# Patient Record
Sex: Female | Born: 1935
Health system: Southern US, Community
[De-identification: ages and names within clinical notes are randomized; demographics above are authoritative.]

## PROBLEM LIST (undated history)

## (undated) DIAGNOSIS — I10 Essential (primary) hypertension: Secondary | ICD-10-CM

## (undated) DIAGNOSIS — M1990 Unspecified osteoarthritis, unspecified site: Secondary | ICD-10-CM

## (undated) HISTORY — DX: Unspecified osteoarthritis, unspecified site: M19.90

## (undated) HISTORY — DX: Essential (primary) hypertension: I10

---

## 2015-08-15 DIAGNOSIS — J029 Acute pharyngitis, unspecified: Secondary | ICD-10-CM | POA: Diagnosis not present

## 2015-08-15 DIAGNOSIS — Z79899 Other long term (current) drug therapy: Secondary | ICD-10-CM | POA: Diagnosis not present

## 2015-08-15 DIAGNOSIS — R51 Headache: Secondary | ICD-10-CM | POA: Diagnosis not present

## 2015-08-15 DIAGNOSIS — I10 Essential (primary) hypertension: Secondary | ICD-10-CM | POA: Diagnosis not present

## 2015-09-20 DIAGNOSIS — I10 Essential (primary) hypertension: Secondary | ICD-10-CM | POA: Diagnosis not present

## 2015-09-20 DIAGNOSIS — N183 Chronic kidney disease, stage 3 (moderate): Secondary | ICD-10-CM | POA: Diagnosis not present

## 2015-09-20 DIAGNOSIS — Z0000 Encounter for general adult medical examination without abnormal findings: Secondary | ICD-10-CM | POA: Diagnosis not present

## 2016-02-07 DIAGNOSIS — J4520 Mild intermittent asthma, uncomplicated: Secondary | ICD-10-CM | POA: Diagnosis not present

## 2016-02-07 DIAGNOSIS — Z1389 Encounter for screening for other disorder: Secondary | ICD-10-CM | POA: Diagnosis not present

## 2016-02-07 DIAGNOSIS — I10 Essential (primary) hypertension: Secondary | ICD-10-CM | POA: Diagnosis not present

## 2016-02-07 DIAGNOSIS — Z0000 Encounter for general adult medical examination without abnormal findings: Secondary | ICD-10-CM | POA: Diagnosis not present

## 2016-04-04 DIAGNOSIS — J441 Chronic obstructive pulmonary disease with (acute) exacerbation: Secondary | ICD-10-CM | POA: Diagnosis not present

## 2016-04-04 DIAGNOSIS — I10 Essential (primary) hypertension: Secondary | ICD-10-CM | POA: Diagnosis not present

## 2016-04-18 DIAGNOSIS — G4489 Other headache syndrome: Secondary | ICD-10-CM | POA: Diagnosis not present

## 2016-04-18 DIAGNOSIS — J4520 Mild intermittent asthma, uncomplicated: Secondary | ICD-10-CM | POA: Diagnosis not present

## 2016-04-18 DIAGNOSIS — I10 Essential (primary) hypertension: Secondary | ICD-10-CM | POA: Diagnosis not present

## 2016-04-28 DIAGNOSIS — H40013 Open angle with borderline findings, low risk, bilateral: Secondary | ICD-10-CM | POA: Diagnosis not present

## 2016-04-28 DIAGNOSIS — R51 Headache: Secondary | ICD-10-CM | POA: Diagnosis not present

## 2016-04-28 DIAGNOSIS — H524 Presbyopia: Secondary | ICD-10-CM | POA: Diagnosis not present

## 2016-05-05 DIAGNOSIS — H40013 Open angle with borderline findings, low risk, bilateral: Secondary | ICD-10-CM | POA: Diagnosis not present

## 2016-05-05 DIAGNOSIS — R51 Headache: Secondary | ICD-10-CM | POA: Diagnosis not present

## 2016-05-06 DIAGNOSIS — Z23 Encounter for immunization: Secondary | ICD-10-CM | POA: Diagnosis not present

## 2016-05-09 DIAGNOSIS — I10 Essential (primary) hypertension: Secondary | ICD-10-CM | POA: Diagnosis not present

## 2016-05-09 DIAGNOSIS — J4520 Mild intermittent asthma, uncomplicated: Secondary | ICD-10-CM | POA: Diagnosis not present

## 2016-05-20 DIAGNOSIS — I10 Essential (primary) hypertension: Secondary | ICD-10-CM | POA: Diagnosis not present

## 2016-05-20 DIAGNOSIS — J441 Chronic obstructive pulmonary disease with (acute) exacerbation: Secondary | ICD-10-CM | POA: Diagnosis not present

## 2016-06-27 DIAGNOSIS — I10 Essential (primary) hypertension: Secondary | ICD-10-CM | POA: Diagnosis not present

## 2016-06-27 DIAGNOSIS — J441 Chronic obstructive pulmonary disease with (acute) exacerbation: Secondary | ICD-10-CM | POA: Diagnosis not present

## 2016-07-10 DIAGNOSIS — I10 Essential (primary) hypertension: Secondary | ICD-10-CM | POA: Diagnosis not present

## 2016-07-10 DIAGNOSIS — M25562 Pain in left knee: Secondary | ICD-10-CM | POA: Diagnosis not present

## 2016-07-10 DIAGNOSIS — J4520 Mild intermittent asthma, uncomplicated: Secondary | ICD-10-CM | POA: Diagnosis not present

## 2016-07-29 DIAGNOSIS — I10 Essential (primary) hypertension: Secondary | ICD-10-CM | POA: Diagnosis not present

## 2016-07-29 DIAGNOSIS — J441 Chronic obstructive pulmonary disease with (acute) exacerbation: Secondary | ICD-10-CM | POA: Diagnosis not present

## 2016-08-11 DIAGNOSIS — J948 Other specified pleural conditions: Secondary | ICD-10-CM | POA: Diagnosis not present

## 2016-08-11 DIAGNOSIS — R1114 Bilious vomiting: Secondary | ICD-10-CM | POA: Diagnosis not present

## 2016-08-12 DIAGNOSIS — R1114 Bilious vomiting: Secondary | ICD-10-CM | POA: Diagnosis not present

## 2016-08-13 DIAGNOSIS — R1114 Bilious vomiting: Secondary | ICD-10-CM | POA: Diagnosis not present

## 2016-08-14 DIAGNOSIS — R1114 Bilious vomiting: Secondary | ICD-10-CM | POA: Diagnosis not present

## 2016-08-15 DIAGNOSIS — R1114 Bilious vomiting: Secondary | ICD-10-CM | POA: Diagnosis not present

## 2016-08-21 DIAGNOSIS — H40023 Open angle with borderline findings, high risk, bilateral: Secondary | ICD-10-CM | POA: Diagnosis not present

## 2016-09-05 DIAGNOSIS — J441 Chronic obstructive pulmonary disease with (acute) exacerbation: Secondary | ICD-10-CM | POA: Diagnosis not present

## 2016-09-05 DIAGNOSIS — I10 Essential (primary) hypertension: Secondary | ICD-10-CM | POA: Diagnosis not present

## 2016-10-13 DIAGNOSIS — J018 Other acute sinusitis: Secondary | ICD-10-CM | POA: Diagnosis not present

## 2016-10-13 DIAGNOSIS — J4520 Mild intermittent asthma, uncomplicated: Secondary | ICD-10-CM | POA: Diagnosis not present

## 2016-10-13 DIAGNOSIS — M25562 Pain in left knee: Secondary | ICD-10-CM | POA: Diagnosis not present

## 2016-10-13 DIAGNOSIS — Z0000 Encounter for general adult medical examination without abnormal findings: Secondary | ICD-10-CM | POA: Diagnosis not present

## 2016-10-13 DIAGNOSIS — Z1389 Encounter for screening for other disorder: Secondary | ICD-10-CM | POA: Diagnosis not present

## 2016-10-13 DIAGNOSIS — I10 Essential (primary) hypertension: Secondary | ICD-10-CM | POA: Diagnosis not present

## 2016-10-31 DIAGNOSIS — I10 Essential (primary) hypertension: Secondary | ICD-10-CM | POA: Diagnosis not present

## 2016-10-31 DIAGNOSIS — J441 Chronic obstructive pulmonary disease with (acute) exacerbation: Secondary | ICD-10-CM | POA: Diagnosis not present

## 2016-11-26 DIAGNOSIS — J441 Chronic obstructive pulmonary disease with (acute) exacerbation: Secondary | ICD-10-CM | POA: Diagnosis not present

## 2016-11-26 DIAGNOSIS — I10 Essential (primary) hypertension: Secondary | ICD-10-CM | POA: Diagnosis not present

## 2017-01-13 DIAGNOSIS — J4520 Mild intermittent asthma, uncomplicated: Secondary | ICD-10-CM | POA: Diagnosis not present

## 2017-01-13 DIAGNOSIS — I10 Essential (primary) hypertension: Secondary | ICD-10-CM | POA: Diagnosis not present

## 2017-01-13 DIAGNOSIS — N183 Chronic kidney disease, stage 3 (moderate): Secondary | ICD-10-CM | POA: Diagnosis not present

## 2017-01-13 DIAGNOSIS — M25562 Pain in left knee: Secondary | ICD-10-CM | POA: Diagnosis not present

## 2017-02-19 DIAGNOSIS — J441 Chronic obstructive pulmonary disease with (acute) exacerbation: Secondary | ICD-10-CM | POA: Diagnosis not present

## 2017-02-19 DIAGNOSIS — I10 Essential (primary) hypertension: Secondary | ICD-10-CM | POA: Diagnosis not present

## 2017-03-05 DIAGNOSIS — H671 Otitis media in diseases classified elsewhere, right ear: Secondary | ICD-10-CM | POA: Diagnosis not present

## 2017-04-07 DIAGNOSIS — J441 Chronic obstructive pulmonary disease with (acute) exacerbation: Secondary | ICD-10-CM | POA: Diagnosis not present

## 2017-04-07 DIAGNOSIS — I10 Essential (primary) hypertension: Secondary | ICD-10-CM | POA: Diagnosis not present

## 2017-05-26 DIAGNOSIS — J441 Chronic obstructive pulmonary disease with (acute) exacerbation: Secondary | ICD-10-CM | POA: Diagnosis not present

## 2017-05-26 DIAGNOSIS — I10 Essential (primary) hypertension: Secondary | ICD-10-CM | POA: Diagnosis not present

## 2017-06-08 DIAGNOSIS — I10 Essential (primary) hypertension: Secondary | ICD-10-CM | POA: Diagnosis not present

## 2017-06-08 DIAGNOSIS — M1A3710 Chronic gout due to renal impairment, right ankle and foot, without tophus (tophi): Secondary | ICD-10-CM | POA: Diagnosis not present

## 2017-06-23 DIAGNOSIS — J441 Chronic obstructive pulmonary disease with (acute) exacerbation: Secondary | ICD-10-CM | POA: Diagnosis not present

## 2017-06-23 DIAGNOSIS — I10 Essential (primary) hypertension: Secondary | ICD-10-CM | POA: Diagnosis not present

## 2017-07-20 DIAGNOSIS — I10 Essential (primary) hypertension: Secondary | ICD-10-CM | POA: Diagnosis not present

## 2017-07-20 DIAGNOSIS — J441 Chronic obstructive pulmonary disease with (acute) exacerbation: Secondary | ICD-10-CM | POA: Diagnosis not present

## 2017-07-22 DIAGNOSIS — I10 Essential (primary) hypertension: Secondary | ICD-10-CM | POA: Diagnosis not present

## 2017-07-22 DIAGNOSIS — J441 Chronic obstructive pulmonary disease with (acute) exacerbation: Secondary | ICD-10-CM | POA: Diagnosis not present

## 2017-09-10 DIAGNOSIS — M1A3710 Chronic gout due to renal impairment, right ankle and foot, without tophus (tophi): Secondary | ICD-10-CM | POA: Diagnosis not present

## 2017-09-10 DIAGNOSIS — Z6827 Body mass index (BMI) 27.0-27.9, adult: Secondary | ICD-10-CM | POA: Diagnosis not present

## 2017-09-10 DIAGNOSIS — M1712 Unilateral primary osteoarthritis, left knee: Secondary | ICD-10-CM | POA: Diagnosis not present

## 2017-09-10 DIAGNOSIS — I10 Essential (primary) hypertension: Secondary | ICD-10-CM | POA: Diagnosis not present

## 2017-09-22 DIAGNOSIS — J441 Chronic obstructive pulmonary disease with (acute) exacerbation: Secondary | ICD-10-CM | POA: Diagnosis not present

## 2017-09-22 DIAGNOSIS — I10 Essential (primary) hypertension: Secondary | ICD-10-CM | POA: Diagnosis not present

## 2017-10-09 DIAGNOSIS — I10 Essential (primary) hypertension: Secondary | ICD-10-CM | POA: Diagnosis not present

## 2017-10-09 DIAGNOSIS — J441 Chronic obstructive pulmonary disease with (acute) exacerbation: Secondary | ICD-10-CM | POA: Diagnosis not present

## 2017-11-18 DIAGNOSIS — J441 Chronic obstructive pulmonary disease with (acute) exacerbation: Secondary | ICD-10-CM | POA: Diagnosis not present

## 2017-11-18 DIAGNOSIS — I10 Essential (primary) hypertension: Secondary | ICD-10-CM | POA: Diagnosis not present

## 2017-12-08 DIAGNOSIS — Z6827 Body mass index (BMI) 27.0-27.9, adult: Secondary | ICD-10-CM | POA: Diagnosis not present

## 2017-12-08 DIAGNOSIS — M1A3710 Chronic gout due to renal impairment, right ankle and foot, without tophus (tophi): Secondary | ICD-10-CM | POA: Diagnosis not present

## 2017-12-08 DIAGNOSIS — J40 Bronchitis, not specified as acute or chronic: Secondary | ICD-10-CM | POA: Diagnosis not present

## 2017-12-08 DIAGNOSIS — M1712 Unilateral primary osteoarthritis, left knee: Secondary | ICD-10-CM | POA: Diagnosis not present

## 2017-12-08 DIAGNOSIS — I10 Essential (primary) hypertension: Secondary | ICD-10-CM | POA: Diagnosis not present

## 2018-01-01 DIAGNOSIS — J441 Chronic obstructive pulmonary disease with (acute) exacerbation: Secondary | ICD-10-CM | POA: Diagnosis not present

## 2018-01-01 DIAGNOSIS — I10 Essential (primary) hypertension: Secondary | ICD-10-CM | POA: Diagnosis not present

## 2018-01-01 DIAGNOSIS — M1712 Unilateral primary osteoarthritis, left knee: Secondary | ICD-10-CM | POA: Diagnosis not present

## 2018-02-23 DIAGNOSIS — I10 Essential (primary) hypertension: Secondary | ICD-10-CM | POA: Diagnosis not present

## 2018-02-23 DIAGNOSIS — M1712 Unilateral primary osteoarthritis, left knee: Secondary | ICD-10-CM | POA: Diagnosis not present

## 2018-02-23 DIAGNOSIS — J441 Chronic obstructive pulmonary disease with (acute) exacerbation: Secondary | ICD-10-CM | POA: Diagnosis not present

## 2018-03-11 DIAGNOSIS — M1712 Unilateral primary osteoarthritis, left knee: Secondary | ICD-10-CM | POA: Diagnosis not present

## 2018-03-11 DIAGNOSIS — N182 Chronic kidney disease, stage 2 (mild): Secondary | ICD-10-CM | POA: Diagnosis not present

## 2018-03-11 DIAGNOSIS — Z6826 Body mass index (BMI) 26.0-26.9, adult: Secondary | ICD-10-CM | POA: Diagnosis not present

## 2018-03-11 DIAGNOSIS — I10 Essential (primary) hypertension: Secondary | ICD-10-CM | POA: Diagnosis not present

## 2018-03-11 DIAGNOSIS — M1A3710 Chronic gout due to renal impairment, right ankle and foot, without tophus (tophi): Secondary | ICD-10-CM | POA: Diagnosis not present

## 2018-03-23 DIAGNOSIS — M1712 Unilateral primary osteoarthritis, left knee: Secondary | ICD-10-CM | POA: Diagnosis not present

## 2018-03-23 DIAGNOSIS — I10 Essential (primary) hypertension: Secondary | ICD-10-CM | POA: Diagnosis not present

## 2018-03-23 DIAGNOSIS — N182 Chronic kidney disease, stage 2 (mild): Secondary | ICD-10-CM | POA: Diagnosis not present

## 2018-04-19 DIAGNOSIS — M1712 Unilateral primary osteoarthritis, left knee: Secondary | ICD-10-CM | POA: Diagnosis not present

## 2018-04-19 DIAGNOSIS — I10 Essential (primary) hypertension: Secondary | ICD-10-CM | POA: Diagnosis not present

## 2018-04-19 DIAGNOSIS — N182 Chronic kidney disease, stage 2 (mild): Secondary | ICD-10-CM | POA: Diagnosis not present

## 2018-05-14 DIAGNOSIS — M1712 Unilateral primary osteoarthritis, left knee: Secondary | ICD-10-CM | POA: Diagnosis not present

## 2018-05-14 DIAGNOSIS — N182 Chronic kidney disease, stage 2 (mild): Secondary | ICD-10-CM | POA: Diagnosis not present

## 2018-05-14 DIAGNOSIS — I10 Essential (primary) hypertension: Secondary | ICD-10-CM | POA: Diagnosis not present

## 2018-06-11 DIAGNOSIS — N182 Chronic kidney disease, stage 2 (mild): Secondary | ICD-10-CM | POA: Diagnosis not present

## 2018-06-11 DIAGNOSIS — M1712 Unilateral primary osteoarthritis, left knee: Secondary | ICD-10-CM | POA: Diagnosis not present

## 2018-06-11 DIAGNOSIS — I10 Essential (primary) hypertension: Secondary | ICD-10-CM | POA: Diagnosis not present

## 2018-06-15 DIAGNOSIS — M1712 Unilateral primary osteoarthritis, left knee: Secondary | ICD-10-CM | POA: Diagnosis not present

## 2018-06-15 DIAGNOSIS — Z0000 Encounter for general adult medical examination without abnormal findings: Secondary | ICD-10-CM | POA: Diagnosis not present

## 2018-06-15 DIAGNOSIS — M1A3710 Chronic gout due to renal impairment, right ankle and foot, without tophus (tophi): Secondary | ICD-10-CM | POA: Diagnosis not present

## 2018-06-15 DIAGNOSIS — I10 Essential (primary) hypertension: Secondary | ICD-10-CM | POA: Diagnosis not present

## 2018-06-15 DIAGNOSIS — N182 Chronic kidney disease, stage 2 (mild): Secondary | ICD-10-CM | POA: Diagnosis not present

## 2018-06-15 DIAGNOSIS — Z1389 Encounter for screening for other disorder: Secondary | ICD-10-CM | POA: Diagnosis not present

## 2018-06-15 DIAGNOSIS — Z6826 Body mass index (BMI) 26.0-26.9, adult: Secondary | ICD-10-CM | POA: Diagnosis not present

## 2018-06-15 DIAGNOSIS — J449 Chronic obstructive pulmonary disease, unspecified: Secondary | ICD-10-CM | POA: Diagnosis not present

## 2018-07-08 DIAGNOSIS — M85832 Other specified disorders of bone density and structure, left forearm: Secondary | ICD-10-CM | POA: Diagnosis not present

## 2018-07-08 DIAGNOSIS — M810 Age-related osteoporosis without current pathological fracture: Secondary | ICD-10-CM | POA: Diagnosis not present

## 2018-07-29 DIAGNOSIS — Z6829 Body mass index (BMI) 29.0-29.9, adult: Secondary | ICD-10-CM | POA: Diagnosis not present

## 2018-07-29 DIAGNOSIS — R05 Cough: Secondary | ICD-10-CM | POA: Diagnosis not present

## 2018-07-29 DIAGNOSIS — R0602 Shortness of breath: Secondary | ICD-10-CM | POA: Diagnosis not present

## 2018-07-29 DIAGNOSIS — J441 Chronic obstructive pulmonary disease with (acute) exacerbation: Secondary | ICD-10-CM | POA: Diagnosis not present

## 2018-07-29 DIAGNOSIS — J449 Chronic obstructive pulmonary disease, unspecified: Secondary | ICD-10-CM | POA: Diagnosis not present

## 2018-07-30 DIAGNOSIS — I10 Essential (primary) hypertension: Secondary | ICD-10-CM | POA: Diagnosis not present

## 2018-07-30 DIAGNOSIS — J449 Chronic obstructive pulmonary disease, unspecified: Secondary | ICD-10-CM | POA: Diagnosis not present

## 2018-09-07 DIAGNOSIS — J449 Chronic obstructive pulmonary disease, unspecified: Secondary | ICD-10-CM | POA: Diagnosis not present

## 2018-09-07 DIAGNOSIS — I10 Essential (primary) hypertension: Secondary | ICD-10-CM | POA: Diagnosis not present

## 2018-09-21 DIAGNOSIS — Z0000 Encounter for general adult medical examination without abnormal findings: Secondary | ICD-10-CM | POA: Diagnosis not present

## 2018-09-21 DIAGNOSIS — Z6829 Body mass index (BMI) 29.0-29.9, adult: Secondary | ICD-10-CM | POA: Diagnosis not present

## 2018-09-21 DIAGNOSIS — J441 Chronic obstructive pulmonary disease with (acute) exacerbation: Secondary | ICD-10-CM | POA: Diagnosis not present

## 2018-09-21 DIAGNOSIS — I10 Essential (primary) hypertension: Secondary | ICD-10-CM | POA: Diagnosis not present

## 2018-10-04 DIAGNOSIS — I10 Essential (primary) hypertension: Secondary | ICD-10-CM | POA: Diagnosis not present

## 2018-10-04 DIAGNOSIS — J441 Chronic obstructive pulmonary disease with (acute) exacerbation: Secondary | ICD-10-CM | POA: Diagnosis not present

## 2018-11-22 DIAGNOSIS — T162XXA Foreign body in left ear, initial encounter: Secondary | ICD-10-CM | POA: Diagnosis not present

## 2018-11-22 DIAGNOSIS — Z6830 Body mass index (BMI) 30.0-30.9, adult: Secondary | ICD-10-CM | POA: Diagnosis not present

## 2018-11-25 DIAGNOSIS — I10 Essential (primary) hypertension: Secondary | ICD-10-CM | POA: Diagnosis not present

## 2018-11-25 DIAGNOSIS — J449 Chronic obstructive pulmonary disease, unspecified: Secondary | ICD-10-CM | POA: Diagnosis not present

## 2018-12-03 DIAGNOSIS — E7800 Pure hypercholesterolemia, unspecified: Secondary | ICD-10-CM | POA: Diagnosis not present

## 2018-12-03 DIAGNOSIS — T162XXA Foreign body in left ear, initial encounter: Secondary | ICD-10-CM | POA: Diagnosis not present

## 2018-12-03 DIAGNOSIS — Z79899 Other long term (current) drug therapy: Secondary | ICD-10-CM | POA: Diagnosis not present

## 2018-12-03 DIAGNOSIS — I10 Essential (primary) hypertension: Secondary | ICD-10-CM | POA: Diagnosis not present

## 2018-12-21 DIAGNOSIS — J449 Chronic obstructive pulmonary disease, unspecified: Secondary | ICD-10-CM | POA: Diagnosis not present

## 2018-12-21 DIAGNOSIS — I10 Essential (primary) hypertension: Secondary | ICD-10-CM | POA: Diagnosis not present

## 2018-12-21 DIAGNOSIS — Z6830 Body mass index (BMI) 30.0-30.9, adult: Secondary | ICD-10-CM | POA: Diagnosis not present

## 2018-12-29 DIAGNOSIS — L110 Acquired keratosis follicularis: Secondary | ICD-10-CM | POA: Diagnosis not present

## 2018-12-29 DIAGNOSIS — M79671 Pain in right foot: Secondary | ICD-10-CM | POA: Diagnosis not present

## 2018-12-29 DIAGNOSIS — M79672 Pain in left foot: Secondary | ICD-10-CM | POA: Diagnosis not present

## 2018-12-29 DIAGNOSIS — I739 Peripheral vascular disease, unspecified: Secondary | ICD-10-CM | POA: Diagnosis not present

## 2018-12-29 DIAGNOSIS — M79674 Pain in right toe(s): Secondary | ICD-10-CM | POA: Diagnosis not present

## 2018-12-29 DIAGNOSIS — M2042 Other hammer toe(s) (acquired), left foot: Secondary | ICD-10-CM | POA: Diagnosis not present

## 2018-12-29 DIAGNOSIS — M79675 Pain in left toe(s): Secondary | ICD-10-CM | POA: Diagnosis not present

## 2018-12-30 DIAGNOSIS — H524 Presbyopia: Secondary | ICD-10-CM | POA: Diagnosis not present

## 2018-12-31 DIAGNOSIS — I10 Essential (primary) hypertension: Secondary | ICD-10-CM | POA: Diagnosis not present

## 2018-12-31 DIAGNOSIS — J449 Chronic obstructive pulmonary disease, unspecified: Secondary | ICD-10-CM | POA: Diagnosis not present

## 2019-01-31 DIAGNOSIS — J449 Chronic obstructive pulmonary disease, unspecified: Secondary | ICD-10-CM | POA: Diagnosis not present

## 2019-01-31 DIAGNOSIS — I10 Essential (primary) hypertension: Secondary | ICD-10-CM | POA: Diagnosis not present

## 2019-02-25 DIAGNOSIS — Z79899 Other long term (current) drug therapy: Secondary | ICD-10-CM | POA: Diagnosis not present

## 2019-02-25 DIAGNOSIS — I10 Essential (primary) hypertension: Secondary | ICD-10-CM | POA: Diagnosis not present

## 2019-02-25 DIAGNOSIS — X501XXA Overexertion from prolonged static or awkward postures, initial encounter: Secondary | ICD-10-CM | POA: Diagnosis not present

## 2019-02-25 DIAGNOSIS — S76312A Strain of muscle, fascia and tendon of the posterior muscle group at thigh level, left thigh, initial encounter: Secondary | ICD-10-CM | POA: Diagnosis not present

## 2019-02-25 DIAGNOSIS — M1712 Unilateral primary osteoarthritis, left knee: Secondary | ICD-10-CM | POA: Diagnosis not present

## 2019-03-11 DIAGNOSIS — I10 Essential (primary) hypertension: Secondary | ICD-10-CM | POA: Diagnosis not present

## 2019-03-11 DIAGNOSIS — J449 Chronic obstructive pulmonary disease, unspecified: Secondary | ICD-10-CM | POA: Diagnosis not present

## 2019-03-23 DIAGNOSIS — J449 Chronic obstructive pulmonary disease, unspecified: Secondary | ICD-10-CM | POA: Diagnosis not present

## 2019-03-23 DIAGNOSIS — M10051 Idiopathic gout, right hip: Secondary | ICD-10-CM | POA: Diagnosis not present

## 2019-03-23 DIAGNOSIS — I10 Essential (primary) hypertension: Secondary | ICD-10-CM | POA: Diagnosis not present

## 2019-03-23 DIAGNOSIS — Z6830 Body mass index (BMI) 30.0-30.9, adult: Secondary | ICD-10-CM | POA: Diagnosis not present

## 2019-04-18 DIAGNOSIS — M79662 Pain in left lower leg: Secondary | ICD-10-CM | POA: Diagnosis not present

## 2019-04-18 DIAGNOSIS — Z6829 Body mass index (BMI) 29.0-29.9, adult: Secondary | ICD-10-CM | POA: Diagnosis not present

## 2019-04-19 DIAGNOSIS — J449 Chronic obstructive pulmonary disease, unspecified: Secondary | ICD-10-CM | POA: Diagnosis not present

## 2019-04-19 DIAGNOSIS — I10 Essential (primary) hypertension: Secondary | ICD-10-CM | POA: Diagnosis not present

## 2019-04-22 DIAGNOSIS — M79662 Pain in left lower leg: Secondary | ICD-10-CM | POA: Diagnosis not present

## 2019-05-23 DIAGNOSIS — I10 Essential (primary) hypertension: Secondary | ICD-10-CM | POA: Diagnosis not present

## 2019-05-23 DIAGNOSIS — J449 Chronic obstructive pulmonary disease, unspecified: Secondary | ICD-10-CM | POA: Diagnosis not present

## 2019-06-21 DIAGNOSIS — I10 Essential (primary) hypertension: Secondary | ICD-10-CM | POA: Diagnosis not present

## 2019-06-21 DIAGNOSIS — J449 Chronic obstructive pulmonary disease, unspecified: Secondary | ICD-10-CM | POA: Diagnosis not present

## 2019-06-28 DIAGNOSIS — Z6829 Body mass index (BMI) 29.0-29.9, adult: Secondary | ICD-10-CM | POA: Diagnosis not present

## 2019-06-28 DIAGNOSIS — M79662 Pain in left lower leg: Secondary | ICD-10-CM | POA: Diagnosis not present

## 2019-06-28 DIAGNOSIS — M189 Osteoarthritis of first carpometacarpal joint, unspecified: Secondary | ICD-10-CM | POA: Diagnosis not present

## 2019-06-28 DIAGNOSIS — Z0000 Encounter for general adult medical examination without abnormal findings: Secondary | ICD-10-CM | POA: Diagnosis not present

## 2019-06-28 DIAGNOSIS — I10 Essential (primary) hypertension: Secondary | ICD-10-CM | POA: Diagnosis not present

## 2019-06-28 DIAGNOSIS — Z1389 Encounter for screening for other disorder: Secondary | ICD-10-CM | POA: Diagnosis not present

## 2019-07-18 DIAGNOSIS — J449 Chronic obstructive pulmonary disease, unspecified: Secondary | ICD-10-CM | POA: Diagnosis not present

## 2019-07-18 DIAGNOSIS — I10 Essential (primary) hypertension: Secondary | ICD-10-CM | POA: Diagnosis not present

## 2019-08-15 DIAGNOSIS — I10 Essential (primary) hypertension: Secondary | ICD-10-CM | POA: Diagnosis not present

## 2019-08-15 DIAGNOSIS — J449 Chronic obstructive pulmonary disease, unspecified: Secondary | ICD-10-CM | POA: Diagnosis not present

## 2019-09-08 DIAGNOSIS — I10 Essential (primary) hypertension: Secondary | ICD-10-CM | POA: Diagnosis not present

## 2019-09-08 DIAGNOSIS — J449 Chronic obstructive pulmonary disease, unspecified: Secondary | ICD-10-CM | POA: Diagnosis not present

## 2019-09-19 DIAGNOSIS — M1712 Unilateral primary osteoarthritis, left knee: Secondary | ICD-10-CM | POA: Diagnosis not present

## 2019-09-19 DIAGNOSIS — M25462 Effusion, left knee: Secondary | ICD-10-CM | POA: Diagnosis not present

## 2019-09-19 DIAGNOSIS — M25562 Pain in left knee: Secondary | ICD-10-CM | POA: Diagnosis not present

## 2019-09-26 DIAGNOSIS — I10 Essential (primary) hypertension: Secondary | ICD-10-CM | POA: Diagnosis not present

## 2019-09-26 DIAGNOSIS — M79662 Pain in left lower leg: Secondary | ICD-10-CM | POA: Diagnosis not present

## 2019-09-26 DIAGNOSIS — Z0000 Encounter for general adult medical examination without abnormal findings: Secondary | ICD-10-CM | POA: Diagnosis not present

## 2019-09-26 DIAGNOSIS — J4520 Mild intermittent asthma, uncomplicated: Secondary | ICD-10-CM | POA: Diagnosis not present

## 2019-09-26 DIAGNOSIS — Z6828 Body mass index (BMI) 28.0-28.9, adult: Secondary | ICD-10-CM | POA: Diagnosis not present

## 2019-09-29 ENCOUNTER — Encounter: Payer: Self-pay | Admitting: Orthopaedic Surgery

## 2019-09-29 ENCOUNTER — Other Ambulatory Visit: Payer: Self-pay

## 2019-09-29 ENCOUNTER — Ambulatory Visit (INDEPENDENT_AMBULATORY_CARE_PROVIDER_SITE_OTHER): Payer: PPO | Admitting: Orthopaedic Surgery

## 2019-09-29 DIAGNOSIS — M1712 Unilateral primary osteoarthritis, left knee: Secondary | ICD-10-CM

## 2019-09-29 NOTE — Progress Notes (Signed)
Office Visit Note   Patient: Susan Ross           Date of Birth: 11/24/1928           MRN: [id number] Visit Date: 09/29/2019              Requested by: No referring provider defined for this encounter. PCP: Toma Deiters, MD   Assessment & Plan: Visit Diagnoses:  1. Unilateral primary osteoarthritis, left knee     Plan: Patient has some mild medial compartment arthritis by x-ray.  She states recently her knees been doing better.  If not severe enough to consider further treatment in her opinion.  I plan to recheck her in 2 months.  Follow-Up Instructions: No follow-ups on file.   Orders:  No orders of the defined types were placed in this encounter.  No orders of the defined types were placed in this encounter.     Procedures: No procedures performed   Clinical Data: No additional findings.   Subjective: Chief Complaint  Patient presents with  . Left Knee - Pain    HPI 86 year old female seen with greater than 1 year history of knee giving way pain and swelling.  She has had previous x-rays taken and over-the-counter anti-inflammatories used ice and heat nothing really helps.  She does have some hypertension.  No previous surgeries.  She is referred by [facility] for evaluation.  Previous x-ray showed some mild medial joint line narrowing small medial osteophytes.  Mild patellofemoral degenerative changes.  No chondrocalcinosis.  Patient denies any groin pain.  Review of Systems positive for knee pain hypertension no previous surgeries.  She is a Tourist information centre manager.   Objective: Vital Signs: BP (!) 198/106   Pulse 78   Ht 5' (1.524 m)   Wt 160 lb (72.6 kg)   BMI 31.25 kg/m   Physical Exam Constitutional:      Appearance: She is well-developed.  HENT:     Head: Normocephalic.     Right Ear: External ear normal.     Left Ear: External ear normal.  Eyes:     Pupils: Pupils are equal, round, and reactive to light.  Neck:     Thyroid: No thyromegaly.      Trachea: No tracheal deviation.  Cardiovascular:     Rate and Rhythm: Normal rate.  Pulmonary:     Effort: Pulmonary effort is normal.  Abdominal:     Palpations: Abdomen is soft.  Skin:    General: Skin is warm and dry.  Neurological:     Mental Status: She is alert and oriented to person, place, and time.  Psychiatric:        Behavior: Behavior normal.     Ortho Exam patient is amatory with a mild knee limp.  No pain with hip range of motion.  No knee effusion she has some pain with patellofemoral loading patellar tendon quad tendon is normal.  Negative logroll to the hips.  Distal pulses intact.  Specialty Comments:  No specialty comments available.  Imaging: No results found.   PMFS History: Patient Active Problem List   Diagnosis Date Noted  . Unilateral primary osteoarthritis, left knee 09/29/2019   Past Medical History:  Diagnosis Date  . Hypertension   . Osteoarthritis     Family History  Problem Relation Age of Onset  . Cancer Mother   . Congestive Heart Failure Father     History reviewed. No pertinent surgical history. Social History   Occupational  History  . Not on file  Tobacco Use  . Smoking status: Former Research scientist (life sciences)  . Smokeless tobacco: Never Used  Substance and Sexual Activity  . Alcohol use: Not Currently  . Drug use: Not on file  . Sexual activity: Not on file

## 2019-10-03 DIAGNOSIS — J4520 Mild intermittent asthma, uncomplicated: Secondary | ICD-10-CM | POA: Diagnosis not present

## 2019-10-03 DIAGNOSIS — I10 Essential (primary) hypertension: Secondary | ICD-10-CM | POA: Diagnosis not present

## 2019-10-31 DIAGNOSIS — I10 Essential (primary) hypertension: Secondary | ICD-10-CM | POA: Diagnosis not present

## 2019-10-31 DIAGNOSIS — J4520 Mild intermittent asthma, uncomplicated: Secondary | ICD-10-CM | POA: Diagnosis not present

## 2019-12-05 DIAGNOSIS — I10 Essential (primary) hypertension: Secondary | ICD-10-CM | POA: Diagnosis not present

## 2019-12-05 DIAGNOSIS — J4520 Mild intermittent asthma, uncomplicated: Secondary | ICD-10-CM | POA: Diagnosis not present

## 2019-12-27 DIAGNOSIS — Z6828 Body mass index (BMI) 28.0-28.9, adult: Secondary | ICD-10-CM | POA: Diagnosis not present

## 2019-12-27 DIAGNOSIS — M79662 Pain in left lower leg: Secondary | ICD-10-CM | POA: Diagnosis not present

## 2019-12-27 DIAGNOSIS — I10 Essential (primary) hypertension: Secondary | ICD-10-CM | POA: Diagnosis not present

## 2019-12-27 DIAGNOSIS — J449 Chronic obstructive pulmonary disease, unspecified: Secondary | ICD-10-CM | POA: Diagnosis not present

## 2020-01-03 DIAGNOSIS — I10 Essential (primary) hypertension: Secondary | ICD-10-CM | POA: Diagnosis not present

## 2020-01-03 DIAGNOSIS — J449 Chronic obstructive pulmonary disease, unspecified: Secondary | ICD-10-CM | POA: Diagnosis not present

## 2020-02-16 ENCOUNTER — Encounter: Payer: Self-pay | Admitting: Orthopaedic Surgery

## 2020-02-16 ENCOUNTER — Ambulatory Visit (INDEPENDENT_AMBULATORY_CARE_PROVIDER_SITE_OTHER): Payer: PPO | Admitting: Orthopaedic Surgery

## 2020-02-16 ENCOUNTER — Other Ambulatory Visit: Payer: Self-pay

## 2020-02-16 VITALS — Ht 67.0 in | Wt 160.0 lb

## 2020-02-16 DIAGNOSIS — M1712 Unilateral primary osteoarthritis, left knee: Secondary | ICD-10-CM

## 2020-02-16 MED ORDER — LIDOCAINE HCL 1 % IJ SOLN
0.5000 mL | INTRAMUSCULAR | Status: AC | PRN
  Administered 2020-02-16: .5 mL

## 2020-02-16 MED ORDER — METHYLPREDNISOLONE ACETATE 40 MG/ML IJ SUSP
40.0000 mg | INTRAMUSCULAR | Status: AC | PRN
  Administered 2020-02-16: 40 mg via INTRA_ARTICULAR

## 2020-02-16 MED ORDER — BUPIVACAINE HCL 0.5 % IJ SOLN
3.0000 mL | INTRAMUSCULAR | Status: AC | PRN
  Administered 2020-02-16: 3 mL via INTRA_ARTICULAR

## 2020-02-16 NOTE — Progress Notes (Signed)
Office Visit Note   Patient: Susan Ross           Date of Birth: 02-Jun-1929           MRN: [id number] Visit Date: 02/16/2020              Requested by: Toma Deiters, MD [address] [place],  [place] [address] PCP: Toma Deiters, MD   Assessment & Plan: Visit Diagnoses:  1. Unilateral primary osteoarthritis, left knee     Plan: Repeat left knee injection performed Which she tolerated well.  She has persistent problems injection does not give her sustained relief she will call we can proceed with MRI scan.  She does have knee osteoarthritis although it does not appear severe.  She may have more cartilage wears then x ray suggests.  If she is having increased problems will get an MRI scan. Follow-Up Instructions: No follow-ups on file.   Orders:  Orders Placed This Encounter  Procedures  . Large Joint Inj: L knee   No orders of the defined types were placed in this encounter.     Procedures: Large Joint Inj: L knee on 02/16/2020 4:03 PM Indications: joint swelling and pain Details: 22 G 1.5 in needle, anterolateral approach  Arthrogram: No  Medications: 0.5 mL lidocaine 1 %; 3 mL bupivacaine 0.5 %; 40 mg methylPREDNISolone acetate 40 MG/ML Outcome: tolerated well, no immediate complications Procedure, treatment alternatives, risks and benefits explained, specific risks discussed. Consent was given by the patient. Immediately prior to procedure a time out was called to verify the correct patient, procedure, equipment, support staff and site/side marked as required. Patient was prepped and draped in the usual sterile fashion.       Clinical Data: No additional findings.   Subjective: Chief Complaint  Patient presents with  . Left Knee - Pain    HPI 86 year old female with left knee pain medial and posterior with limp.  She had an intra-articular injection for knee osteoarthritis by x-ray which appears to be moderate level of arthritis and she states the  shot worked well for definitely more than a month.  She has had previous total of arthroplasty on the right hip which did well.  She denies any radicular symptoms to her foot.  She has problems with stairs problems going from sitting to standing due to her left knee.  She has had some hydrocodone for pain last Norco was 60 tablets back in January.  Patient been amatory but limping.  Her daughter was here briefly but left since she did not want to see a needle.  Review of Systems no activity intolerance no chest pain negative history of CVA.   Objective: Vital Signs: Ht 5\' 7"  (1.702 m)   Wt 160 lb (72.6 kg)   BMI 25.06 kg/m   Physical Exam Constitutional:      Appearance: She is well-developed.  HENT:     Head: Normocephalic.     Right Ear: External ear normal.     Left Ear: External ear normal.  Eyes:     Pupils: Pupils are equal, round, and reactive to light.  Neck:     Thyroid: No thyromegaly.     Trachea: No tracheal deviation.  Cardiovascular:     Rate and Rhythm: Normal rate.  Pulmonary:     Effort: Pulmonary effort is normal.  Abdominal:     Palpations: Abdomen is soft.  Skin:    General: Skin is warm and dry.  Neurological:     Mental Status: She is alert and oriented to person, place, and time.  Psychiatric:        Behavior: Behavior normal.     Ortho Exam patient has grip with knee extension lacks 15 degrees reaching full extension.  Pain with passive pressure at the end of extension.  She can flex to 115 degrees.  Medial joint line tenderness and some posterior medial tenderness.  Specialty Comments:  No specialty comments available.  Imaging: No results found.   PMFS History: Patient Active Problem List   Diagnosis Date Noted  . Unilateral primary osteoarthritis, left knee 09/29/2019   Past Medical History:  Diagnosis Date  . Hypertension   . Osteoarthritis     Family History  Problem Relation Age of Onset  . Cancer Mother   . Congestive Heart  Failure Father     No past surgical history on file. Social History   Occupational History  . Not on file  Tobacco Use  . Smoking status: Former Games developer  . Smokeless tobacco: Never Used  Substance and Sexual Activity  . Alcohol use: Not Currently  . Drug use: Not on file  . Sexual activity: Not on file

## 2020-02-21 ENCOUNTER — Other Ambulatory Visit: Payer: Self-pay

## 2020-02-24 DIAGNOSIS — J449 Chronic obstructive pulmonary disease, unspecified: Secondary | ICD-10-CM | POA: Diagnosis not present

## 2020-02-24 DIAGNOSIS — I10 Essential (primary) hypertension: Secondary | ICD-10-CM | POA: Diagnosis not present

## 2020-03-23 DIAGNOSIS — I10 Essential (primary) hypertension: Secondary | ICD-10-CM | POA: Diagnosis not present

## 2020-03-23 DIAGNOSIS — J449 Chronic obstructive pulmonary disease, unspecified: Secondary | ICD-10-CM | POA: Diagnosis not present

## 2020-03-28 DIAGNOSIS — I10 Essential (primary) hypertension: Secondary | ICD-10-CM | POA: Diagnosis not present

## 2020-03-28 DIAGNOSIS — M79662 Pain in left lower leg: Secondary | ICD-10-CM | POA: Diagnosis not present

## 2020-03-28 DIAGNOSIS — Z6828 Body mass index (BMI) 28.0-28.9, adult: Secondary | ICD-10-CM | POA: Diagnosis not present

## 2020-03-28 DIAGNOSIS — J449 Chronic obstructive pulmonary disease, unspecified: Secondary | ICD-10-CM | POA: Diagnosis not present

## 2020-03-29 DIAGNOSIS — J449 Chronic obstructive pulmonary disease, unspecified: Secondary | ICD-10-CM | POA: Diagnosis not present

## 2020-03-29 DIAGNOSIS — I10 Essential (primary) hypertension: Secondary | ICD-10-CM | POA: Diagnosis not present

## 2020-04-26 DIAGNOSIS — M542 Cervicalgia: Secondary | ICD-10-CM | POA: Diagnosis not present

## 2020-04-26 DIAGNOSIS — Z6828 Body mass index (BMI) 28.0-28.9, adult: Secondary | ICD-10-CM | POA: Diagnosis not present

## 2020-04-26 DIAGNOSIS — M545 Low back pain: Secondary | ICD-10-CM | POA: Diagnosis not present

## 2020-05-10 DIAGNOSIS — J449 Chronic obstructive pulmonary disease, unspecified: Secondary | ICD-10-CM | POA: Diagnosis not present

## 2020-05-10 DIAGNOSIS — I10 Essential (primary) hypertension: Secondary | ICD-10-CM | POA: Diagnosis not present

## 2020-06-08 DIAGNOSIS — I10 Essential (primary) hypertension: Secondary | ICD-10-CM | POA: Diagnosis not present

## 2020-06-08 DIAGNOSIS — J449 Chronic obstructive pulmonary disease, unspecified: Secondary | ICD-10-CM | POA: Diagnosis not present

## 2020-06-27 DIAGNOSIS — M542 Cervicalgia: Secondary | ICD-10-CM | POA: Diagnosis not present

## 2020-06-27 DIAGNOSIS — J449 Chronic obstructive pulmonary disease, unspecified: Secondary | ICD-10-CM | POA: Diagnosis not present

## 2020-06-27 DIAGNOSIS — N189 Chronic kidney disease, unspecified: Secondary | ICD-10-CM | POA: Diagnosis not present

## 2020-06-27 DIAGNOSIS — M545 Low back pain, unspecified: Secondary | ICD-10-CM | POA: Diagnosis not present

## 2020-06-27 DIAGNOSIS — Z6828 Body mass index (BMI) 28.0-28.9, adult: Secondary | ICD-10-CM | POA: Diagnosis not present

## 2020-06-27 DIAGNOSIS — I10 Essential (primary) hypertension: Secondary | ICD-10-CM | POA: Diagnosis not present

## 2020-07-06 DIAGNOSIS — J449 Chronic obstructive pulmonary disease, unspecified: Secondary | ICD-10-CM | POA: Diagnosis not present

## 2020-07-06 DIAGNOSIS — M545 Low back pain, unspecified: Secondary | ICD-10-CM | POA: Diagnosis not present

## 2020-07-06 DIAGNOSIS — I10 Essential (primary) hypertension: Secondary | ICD-10-CM | POA: Diagnosis not present

## 2020-07-06 DIAGNOSIS — N189 Chronic kidney disease, unspecified: Secondary | ICD-10-CM | POA: Diagnosis not present

## 2020-09-17 DIAGNOSIS — H524 Presbyopia: Secondary | ICD-10-CM | POA: Diagnosis not present

## 2020-09-17 DIAGNOSIS — H40013 Open angle with borderline findings, low risk, bilateral: Secondary | ICD-10-CM | POA: Diagnosis not present

## 2020-09-27 DIAGNOSIS — J449 Chronic obstructive pulmonary disease, unspecified: Secondary | ICD-10-CM | POA: Diagnosis not present

## 2020-09-27 DIAGNOSIS — I10 Essential (primary) hypertension: Secondary | ICD-10-CM | POA: Diagnosis not present

## 2020-09-27 DIAGNOSIS — N189 Chronic kidney disease, unspecified: Secondary | ICD-10-CM | POA: Diagnosis not present

## 2020-09-27 DIAGNOSIS — M545 Low back pain, unspecified: Secondary | ICD-10-CM | POA: Diagnosis not present

## 2020-09-27 DIAGNOSIS — Z6828 Body mass index (BMI) 28.0-28.9, adult: Secondary | ICD-10-CM | POA: Diagnosis not present

## 2020-10-31 ENCOUNTER — Other Ambulatory Visit: Payer: Self-pay | Admitting: Physical Medicine and Rehabilitation

## 2020-10-31 ENCOUNTER — Other Ambulatory Visit (HOSPITAL_COMMUNITY): Payer: Self-pay | Admitting: Physical Medicine and Rehabilitation

## 2020-10-31 DIAGNOSIS — M25551 Pain in right hip: Secondary | ICD-10-CM

## 2020-11-24 DIAGNOSIS — M79605 Pain in left leg: Secondary | ICD-10-CM | POA: Diagnosis not present

## 2020-11-24 DIAGNOSIS — M25562 Pain in left knee: Secondary | ICD-10-CM | POA: Diagnosis not present

## 2020-11-24 DIAGNOSIS — R9431 Abnormal electrocardiogram [ECG] [EKG]: Secondary | ICD-10-CM | POA: Diagnosis not present

## 2020-11-24 DIAGNOSIS — G8929 Other chronic pain: Secondary | ICD-10-CM | POA: Diagnosis not present

## 2020-11-24 DIAGNOSIS — I10 Essential (primary) hypertension: Secondary | ICD-10-CM | POA: Diagnosis not present

## 2020-12-26 DIAGNOSIS — M545 Low back pain, unspecified: Secondary | ICD-10-CM | POA: Diagnosis not present

## 2020-12-26 DIAGNOSIS — J449 Chronic obstructive pulmonary disease, unspecified: Secondary | ICD-10-CM | POA: Diagnosis not present

## 2020-12-26 DIAGNOSIS — Z6829 Body mass index (BMI) 29.0-29.9, adult: Secondary | ICD-10-CM | POA: Diagnosis not present

## 2020-12-26 DIAGNOSIS — N189 Chronic kidney disease, unspecified: Secondary | ICD-10-CM | POA: Diagnosis not present

## 2020-12-26 DIAGNOSIS — I10 Essential (primary) hypertension: Secondary | ICD-10-CM | POA: Diagnosis not present

## 2021-01-21 DIAGNOSIS — R059 Cough, unspecified: Secondary | ICD-10-CM | POA: Diagnosis not present

## 2021-01-21 DIAGNOSIS — I10 Essential (primary) hypertension: Secondary | ICD-10-CM | POA: Diagnosis not present

## 2021-01-21 DIAGNOSIS — Z6829 Body mass index (BMI) 29.0-29.9, adult: Secondary | ICD-10-CM | POA: Diagnosis not present

## 2021-01-21 DIAGNOSIS — J440 Chronic obstructive pulmonary disease with acute lower respiratory infection: Secondary | ICD-10-CM | POA: Diagnosis not present

## 2021-04-02 DIAGNOSIS — N1832 Chronic kidney disease, stage 3b: Secondary | ICD-10-CM | POA: Diagnosis not present

## 2021-04-02 DIAGNOSIS — I10 Essential (primary) hypertension: Secondary | ICD-10-CM | POA: Diagnosis not present

## 2021-04-02 DIAGNOSIS — Z6830 Body mass index (BMI) 30.0-30.9, adult: Secondary | ICD-10-CM | POA: Diagnosis not present

## 2021-04-02 DIAGNOSIS — I5022 Chronic systolic (congestive) heart failure: Secondary | ICD-10-CM | POA: Diagnosis not present

## 2021-04-02 DIAGNOSIS — Z0000 Encounter for general adult medical examination without abnormal findings: Secondary | ICD-10-CM | POA: Diagnosis not present

## 2021-04-02 DIAGNOSIS — J440 Chronic obstructive pulmonary disease with acute lower respiratory infection: Secondary | ICD-10-CM | POA: Diagnosis not present

## 2021-04-02 DIAGNOSIS — R0602 Shortness of breath: Secondary | ICD-10-CM | POA: Diagnosis not present

## 2021-04-16 DIAGNOSIS — I272 Pulmonary hypertension, unspecified: Secondary | ICD-10-CM | POA: Diagnosis not present

## 2021-04-16 DIAGNOSIS — I5022 Chronic systolic (congestive) heart failure: Secondary | ICD-10-CM | POA: Diagnosis not present

## 2021-05-08 DIAGNOSIS — J80 Acute respiratory distress syndrome: Secondary | ICD-10-CM | POA: Diagnosis not present

## 2021-05-08 DIAGNOSIS — I110 Hypertensive heart disease with heart failure: Secondary | ICD-10-CM | POA: Diagnosis not present

## 2021-05-08 DIAGNOSIS — Z20822 Contact with and (suspected) exposure to covid-19: Secondary | ICD-10-CM | POA: Diagnosis not present

## 2021-05-08 DIAGNOSIS — J441 Chronic obstructive pulmonary disease with (acute) exacerbation: Secondary | ICD-10-CM | POA: Diagnosis not present

## 2021-05-08 DIAGNOSIS — R9431 Abnormal electrocardiogram [ECG] [EKG]: Secondary | ICD-10-CM | POA: Diagnosis not present

## 2021-05-08 DIAGNOSIS — R0689 Other abnormalities of breathing: Secondary | ICD-10-CM | POA: Diagnosis not present

## 2021-05-08 DIAGNOSIS — R1084 Generalized abdominal pain: Secondary | ICD-10-CM | POA: Diagnosis not present

## 2021-05-08 DIAGNOSIS — R52 Pain, unspecified: Secondary | ICD-10-CM | POA: Diagnosis not present

## 2021-05-08 DIAGNOSIS — R0602 Shortness of breath: Secondary | ICD-10-CM | POA: Diagnosis not present

## 2021-05-08 DIAGNOSIS — R059 Cough, unspecified: Secondary | ICD-10-CM | POA: Diagnosis not present

## 2021-05-08 DIAGNOSIS — I509 Heart failure, unspecified: Secondary | ICD-10-CM | POA: Diagnosis not present

## 2021-05-08 DIAGNOSIS — R778 Other specified abnormalities of plasma proteins: Secondary | ICD-10-CM | POA: Diagnosis not present

## 2021-05-08 DIAGNOSIS — R000 Tachycardia, unspecified: Secondary | ICD-10-CM | POA: Diagnosis not present

## 2021-05-09 DIAGNOSIS — B179 Acute viral hepatitis, unspecified: Secondary | ICD-10-CM | POA: Diagnosis not present

## 2021-05-09 DIAGNOSIS — E119 Type 2 diabetes mellitus without complications: Secondary | ICD-10-CM | POA: Diagnosis not present

## 2021-05-09 DIAGNOSIS — I5043 Acute on chronic combined systolic (congestive) and diastolic (congestive) heart failure: Secondary | ICD-10-CM | POA: Diagnosis not present

## 2021-05-09 DIAGNOSIS — R0602 Shortness of breath: Secondary | ICD-10-CM | POA: Diagnosis not present

## 2021-05-09 DIAGNOSIS — J9622 Acute and chronic respiratory failure with hypercapnia: Secondary | ICD-10-CM | POA: Diagnosis not present

## 2021-05-09 DIAGNOSIS — Z66 Do not resuscitate: Secondary | ICD-10-CM | POA: Diagnosis not present

## 2021-05-09 DIAGNOSIS — I129 Hypertensive chronic kidney disease with stage 1 through stage 4 chronic kidney disease, or unspecified chronic kidney disease: Secondary | ICD-10-CM | POA: Diagnosis not present

## 2021-05-09 DIAGNOSIS — M1712 Unilateral primary osteoarthritis, left knee: Secondary | ICD-10-CM | POA: Diagnosis not present

## 2021-05-09 DIAGNOSIS — E8729 Other acidosis: Secondary | ICD-10-CM | POA: Diagnosis not present

## 2021-05-09 DIAGNOSIS — R1110 Vomiting, unspecified: Secondary | ICD-10-CM | POA: Diagnosis not present

## 2021-05-09 DIAGNOSIS — R778 Other specified abnormalities of plasma proteins: Secondary | ICD-10-CM | POA: Diagnosis not present

## 2021-05-09 DIAGNOSIS — R918 Other nonspecific abnormal finding of lung field: Secondary | ICD-10-CM | POA: Diagnosis not present

## 2021-05-09 DIAGNOSIS — I130 Hypertensive heart and chronic kidney disease with heart failure and stage 1 through stage 4 chronic kidney disease, or unspecified chronic kidney disease: Secondary | ICD-10-CM | POA: Diagnosis not present

## 2021-05-09 DIAGNOSIS — J441 Chronic obstructive pulmonary disease with (acute) exacerbation: Secondary | ICD-10-CM | POA: Diagnosis not present

## 2021-05-09 DIAGNOSIS — R079 Chest pain, unspecified: Secondary | ICD-10-CM | POA: Diagnosis not present

## 2021-05-09 DIAGNOSIS — I5023 Acute on chronic systolic (congestive) heart failure: Secondary | ICD-10-CM | POA: Diagnosis not present

## 2021-05-09 DIAGNOSIS — B189 Chronic viral hepatitis, unspecified: Secondary | ICD-10-CM | POA: Diagnosis not present

## 2021-05-09 DIAGNOSIS — J9621 Acute and chronic respiratory failure with hypoxia: Secondary | ICD-10-CM | POA: Diagnosis not present

## 2021-05-09 DIAGNOSIS — I3489 Other nonrheumatic mitral valve disorders: Secondary | ICD-10-CM | POA: Diagnosis not present

## 2021-05-09 DIAGNOSIS — I5020 Unspecified systolic (congestive) heart failure: Secondary | ICD-10-CM | POA: Diagnosis not present

## 2021-05-09 DIAGNOSIS — I5041 Acute combined systolic (congestive) and diastolic (congestive) heart failure: Secondary | ICD-10-CM | POA: Diagnosis not present

## 2021-05-09 DIAGNOSIS — J440 Chronic obstructive pulmonary disease with acute lower respiratory infection: Secondary | ICD-10-CM | POA: Diagnosis not present

## 2021-05-09 DIAGNOSIS — Z87891 Personal history of nicotine dependence: Secondary | ICD-10-CM | POA: Diagnosis not present

## 2021-05-09 DIAGNOSIS — N179 Acute kidney failure, unspecified: Secondary | ICD-10-CM | POA: Diagnosis not present

## 2021-05-09 DIAGNOSIS — I2510 Atherosclerotic heart disease of native coronary artery without angina pectoris: Secondary | ICD-10-CM | POA: Diagnosis not present

## 2021-05-09 DIAGNOSIS — I21A1 Myocardial infarction type 2: Secondary | ICD-10-CM | POA: Diagnosis not present

## 2021-05-09 DIAGNOSIS — J449 Chronic obstructive pulmonary disease, unspecified: Secondary | ICD-10-CM | POA: Diagnosis not present

## 2021-05-09 DIAGNOSIS — R9431 Abnormal electrocardiogram [ECG] [EKG]: Secondary | ICD-10-CM | POA: Diagnosis not present

## 2021-05-09 DIAGNOSIS — I5181 Takotsubo syndrome: Secondary | ICD-10-CM | POA: Diagnosis not present

## 2021-05-09 DIAGNOSIS — N189 Chronic kidney disease, unspecified: Secondary | ICD-10-CM | POA: Diagnosis not present

## 2021-05-09 DIAGNOSIS — R0902 Hypoxemia: Secondary | ICD-10-CM | POA: Diagnosis not present

## 2021-05-20 DIAGNOSIS — I5032 Chronic diastolic (congestive) heart failure: Secondary | ICD-10-CM | POA: Diagnosis not present

## 2021-05-20 DIAGNOSIS — J449 Chronic obstructive pulmonary disease, unspecified: Secondary | ICD-10-CM | POA: Diagnosis not present

## 2021-05-20 DIAGNOSIS — N189 Chronic kidney disease, unspecified: Secondary | ICD-10-CM | POA: Diagnosis not present

## 2021-05-20 DIAGNOSIS — Z0000 Encounter for general adult medical examination without abnormal findings: Secondary | ICD-10-CM | POA: Diagnosis not present

## 2021-05-20 DIAGNOSIS — I10 Essential (primary) hypertension: Secondary | ICD-10-CM | POA: Diagnosis not present

## 2021-05-20 DIAGNOSIS — Z6830 Body mass index (BMI) 30.0-30.9, adult: Secondary | ICD-10-CM | POA: Diagnosis not present

## 2021-05-29 DIAGNOSIS — Z6831 Body mass index (BMI) 31.0-31.9, adult: Secondary | ICD-10-CM | POA: Diagnosis not present

## 2021-05-29 DIAGNOSIS — J449 Chronic obstructive pulmonary disease, unspecified: Secondary | ICD-10-CM | POA: Diagnosis not present

## 2021-05-30 DIAGNOSIS — I5022 Chronic systolic (congestive) heart failure: Secondary | ICD-10-CM | POA: Diagnosis not present

## 2021-05-30 DIAGNOSIS — I5181 Takotsubo syndrome: Secondary | ICD-10-CM | POA: Diagnosis not present

## 2021-05-30 DIAGNOSIS — I2510 Atherosclerotic heart disease of native coronary artery without angina pectoris: Secondary | ICD-10-CM | POA: Diagnosis not present

## 2021-05-30 DIAGNOSIS — I10 Essential (primary) hypertension: Secondary | ICD-10-CM | POA: Diagnosis not present

## 2021-06-13 DIAGNOSIS — J449 Chronic obstructive pulmonary disease, unspecified: Secondary | ICD-10-CM | POA: Diagnosis not present

## 2021-06-13 DIAGNOSIS — I5181 Takotsubo syndrome: Secondary | ICD-10-CM | POA: Diagnosis not present

## 2021-06-26 ENCOUNTER — Other Ambulatory Visit: Payer: Self-pay

## 2021-06-26 ENCOUNTER — Encounter: Payer: PPO | Attending: Internal Medicine | Admitting: Nutrition

## 2021-06-26 VITALS — Ht 63.0 in | Wt 173.0 lb

## 2021-06-26 DIAGNOSIS — Z9981 Dependence on supplemental oxygen: Secondary | ICD-10-CM | POA: Insufficient documentation

## 2021-06-26 DIAGNOSIS — I5020 Unspecified systolic (congestive) heart failure: Secondary | ICD-10-CM | POA: Insufficient documentation

## 2021-06-26 DIAGNOSIS — J441 Chronic obstructive pulmonary disease with (acute) exacerbation: Secondary | ICD-10-CM | POA: Insufficient documentation

## 2021-06-26 DIAGNOSIS — I10 Essential (primary) hypertension: Secondary | ICD-10-CM | POA: Insufficient documentation

## 2021-06-26 NOTE — Progress Notes (Signed)
Medical Nutrition Therapy  Appointment Start time:  1300  Appointment End time:  1400  Primary concerns today: CHF and COPD, HTN  Referral diagnosis:  I10. I50.2 Preferred learning style: visual  Learning readiness: Ready   NUTRITION ASSESSMENT  She is here with her daughter. Wants to know what foods to avoid to prevent CHF episode and anything help with her breathing. Still likes to cook some but has to sit on a stool and can't stand long. She walks with assistance, feeds self, very interactive. Limited hearing, but engages in conversation well. Is willing to work on foods that are healthier for her to help her breathing and prevent CHF problems.  Anthropometrics  Wt Readings from Last 3 Encounters:  06/26/21 173 lb (78.5 kg)  02/16/20 160 lb (72.6 kg)  09/29/19 160 lb (72.6 kg)   Ht Readings from Last 3 Encounters:  06/26/21 5\' 3"  (1.6 m)  02/16/20 5\' 7"  (1.702 m)  09/29/19 5' (1.524 m)   Body mass index is 30.65 kg/m. @BMIFA @ Facility age limit for growth percentiles is 20 years. Facility age limit for growth percentiles is 20 years.    Clinical Medical Hx: CHF, COPD, on oxygen Medications: see chart Labs:  Notable Signs/Symptoms: SOB at times, fluid retention at times  Lifestyle & Dietary Hx LIves with family. Needs assistance with dressing and bathing due to breathing issues.  Estimated daily fluid intake: 26 oz Supplements:  Sleep: good Stress / self-care: caring for self and breathing issues Current average weekly physical activity: ADL  24-Hr Dietary Recall Eats 2-3 meals a day. Sometimes isn't hungry. She comes very little but her daughters cook. Use to cook with fat back and salt. Likes most foods. Likes fried foods.  Estimated Energy Needs Calories: 1500-1800 Carbohydrate: 170 g Protein: 112g Fat: 42g   NUTRITION DIAGNOSIS  NB-1.1 Food and nutrition-related knowledge deficit As related to CHF.  As evidenced by Recent hospitalization and eating  foods with higher sodium content..   NUTRITION INTERVENTION  Nutrition education (E-1) on the following topics:  Low Salt Nutrition and CHF education provided on My Plate, CHO counting, meal planning- small frequent meals, portion sizes, timing of meals. Low Salt Diet Reading food labels COPD and foods to avoid-concentrated sweets, carbonated liquids and processed foods.  Handouts Provided Include  CHF Nutrition Therapy Low Salt seasonings  Learning Style & Readiness for Change Teaching method utilized: Visual & Auditory  Demonstrated degree of understanding via: Teach Back  Barriers to learning/adherence to lifestyle change: None  Goals Established by Pt Follow CHF guidelines with foods to avoid Read food labels Eat small frequent meals Focus on nutrient dense foods       Drink 40 oz of water per day       Avoid simple sweets, desserts and processed foods.       Increase fresh fruits and vegetables and whole gains.    MONITORING & EVALUATION Dietary intake, weekly physical activity, and weight and breathing in 2-3 months.  Next Steps  Patient is to eat more fresh fruits and vegetables and less processed foods.

## 2021-07-02 DIAGNOSIS — N1832 Chronic kidney disease, stage 3b: Secondary | ICD-10-CM | POA: Diagnosis not present

## 2021-07-02 DIAGNOSIS — I5032 Chronic diastolic (congestive) heart failure: Secondary | ICD-10-CM | POA: Diagnosis not present

## 2021-07-02 DIAGNOSIS — Z6829 Body mass index (BMI) 29.0-29.9, adult: Secondary | ICD-10-CM | POA: Diagnosis not present

## 2021-07-02 DIAGNOSIS — J449 Chronic obstructive pulmonary disease, unspecified: Secondary | ICD-10-CM | POA: Diagnosis not present

## 2021-07-02 DIAGNOSIS — Z0000 Encounter for general adult medical examination without abnormal findings: Secondary | ICD-10-CM | POA: Diagnosis not present

## 2021-07-02 DIAGNOSIS — E785 Hyperlipidemia, unspecified: Secondary | ICD-10-CM | POA: Diagnosis not present

## 2021-07-10 ENCOUNTER — Encounter: Payer: Self-pay | Admitting: Nutrition

## 2021-07-10 NOTE — Patient Instructions (Signed)
Goals Established by Pt Follow CHF guidelines with foods to avoid Read food labels Eat small frequent meals Focus on nutrient dense foods       Drink 40 oz of water per day       Avoid simple sweets, desserts and processed foods.       Increase fresh fruits and vegetables and whole gains.

## 2021-07-13 DIAGNOSIS — J449 Chronic obstructive pulmonary disease, unspecified: Secondary | ICD-10-CM | POA: Diagnosis not present

## 2021-07-13 DIAGNOSIS — I5181 Takotsubo syndrome: Secondary | ICD-10-CM | POA: Diagnosis not present

## 2021-08-13 DIAGNOSIS — I5181 Takotsubo syndrome: Secondary | ICD-10-CM | POA: Diagnosis not present

## 2021-08-13 DIAGNOSIS — J449 Chronic obstructive pulmonary disease, unspecified: Secondary | ICD-10-CM | POA: Diagnosis not present

## 2021-08-22 ENCOUNTER — Ambulatory Visit: Payer: PPO | Admitting: Nutrition

## 2021-09-02 DIAGNOSIS — I2510 Atherosclerotic heart disease of native coronary artery without angina pectoris: Secondary | ICD-10-CM | POA: Diagnosis not present

## 2021-09-02 DIAGNOSIS — I10 Essential (primary) hypertension: Secondary | ICD-10-CM | POA: Diagnosis not present

## 2021-09-02 DIAGNOSIS — I5022 Chronic systolic (congestive) heart failure: Secondary | ICD-10-CM | POA: Diagnosis not present

## 2021-09-10 DIAGNOSIS — I272 Pulmonary hypertension, unspecified: Secondary | ICD-10-CM | POA: Diagnosis not present

## 2021-09-10 DIAGNOSIS — I5022 Chronic systolic (congestive) heart failure: Secondary | ICD-10-CM | POA: Diagnosis not present

## 2021-09-10 DIAGNOSIS — I340 Nonrheumatic mitral (valve) insufficiency: Secondary | ICD-10-CM | POA: Diagnosis not present

## 2021-09-24 DIAGNOSIS — E785 Hyperlipidemia, unspecified: Secondary | ICD-10-CM | POA: Diagnosis not present

## 2021-09-24 DIAGNOSIS — I10 Essential (primary) hypertension: Secondary | ICD-10-CM | POA: Diagnosis not present

## 2021-09-24 DIAGNOSIS — J449 Chronic obstructive pulmonary disease, unspecified: Secondary | ICD-10-CM | POA: Diagnosis not present

## 2021-09-24 DIAGNOSIS — I5032 Chronic diastolic (congestive) heart failure: Secondary | ICD-10-CM | POA: Diagnosis not present

## 2021-09-24 DIAGNOSIS — N1832 Chronic kidney disease, stage 3b: Secondary | ICD-10-CM | POA: Diagnosis not present

## 2021-09-30 DIAGNOSIS — H40013 Open angle with borderline findings, low risk, bilateral: Secondary | ICD-10-CM | POA: Diagnosis not present

## 2021-10-01 DIAGNOSIS — I10 Essential (primary) hypertension: Secondary | ICD-10-CM | POA: Diagnosis not present

## 2021-10-01 DIAGNOSIS — Z0000 Encounter for general adult medical examination without abnormal findings: Secondary | ICD-10-CM | POA: Diagnosis not present

## 2021-10-01 DIAGNOSIS — E785 Hyperlipidemia, unspecified: Secondary | ICD-10-CM | POA: Diagnosis not present

## 2021-10-01 DIAGNOSIS — Z6830 Body mass index (BMI) 30.0-30.9, adult: Secondary | ICD-10-CM | POA: Diagnosis not present

## 2021-10-01 DIAGNOSIS — I5032 Chronic diastolic (congestive) heart failure: Secondary | ICD-10-CM | POA: Diagnosis not present

## 2021-10-01 DIAGNOSIS — J449 Chronic obstructive pulmonary disease, unspecified: Secondary | ICD-10-CM | POA: Diagnosis not present

## 2021-10-01 DIAGNOSIS — N1832 Chronic kidney disease, stage 3b: Secondary | ICD-10-CM | POA: Diagnosis not present

## 2021-10-11 DIAGNOSIS — I5181 Takotsubo syndrome: Secondary | ICD-10-CM | POA: Diagnosis not present

## 2021-10-11 DIAGNOSIS — J449 Chronic obstructive pulmonary disease, unspecified: Secondary | ICD-10-CM | POA: Diagnosis not present

## 2021-10-15 DIAGNOSIS — Z7951 Long term (current) use of inhaled steroids: Secondary | ICD-10-CM | POA: Diagnosis not present

## 2021-10-15 DIAGNOSIS — R600 Localized edema: Secondary | ICD-10-CM | POA: Diagnosis not present

## 2021-10-15 DIAGNOSIS — R32 Unspecified urinary incontinence: Secondary | ICD-10-CM | POA: Diagnosis not present

## 2021-10-15 DIAGNOSIS — K08109 Complete loss of teeth, unspecified cause, unspecified class: Secondary | ICD-10-CM | POA: Diagnosis not present

## 2021-10-15 DIAGNOSIS — Z809 Family history of malignant neoplasm, unspecified: Secondary | ICD-10-CM | POA: Diagnosis not present

## 2021-10-15 DIAGNOSIS — G8929 Other chronic pain: Secondary | ICD-10-CM | POA: Diagnosis not present

## 2021-10-15 DIAGNOSIS — E785 Hyperlipidemia, unspecified: Secondary | ICD-10-CM | POA: Diagnosis not present

## 2021-10-15 DIAGNOSIS — I10 Essential (primary) hypertension: Secondary | ICD-10-CM | POA: Diagnosis not present

## 2021-10-15 DIAGNOSIS — Z7982 Long term (current) use of aspirin: Secondary | ICD-10-CM | POA: Diagnosis not present

## 2021-10-15 DIAGNOSIS — G629 Polyneuropathy, unspecified: Secondary | ICD-10-CM | POA: Diagnosis not present

## 2021-10-15 DIAGNOSIS — I2510 Atherosclerotic heart disease of native coronary artery without angina pectoris: Secondary | ICD-10-CM | POA: Diagnosis not present

## 2021-10-15 DIAGNOSIS — J309 Allergic rhinitis, unspecified: Secondary | ICD-10-CM | POA: Diagnosis not present

## 2021-10-21 ENCOUNTER — Other Ambulatory Visit: Payer: Self-pay

## 2021-10-21 ENCOUNTER — Ambulatory Visit (INDEPENDENT_AMBULATORY_CARE_PROVIDER_SITE_OTHER): Payer: Medicare HMO

## 2021-10-21 ENCOUNTER — Ambulatory Visit (INDEPENDENT_AMBULATORY_CARE_PROVIDER_SITE_OTHER): Payer: Medicare HMO | Admitting: Pulmonary Disease

## 2021-10-21 ENCOUNTER — Encounter: Payer: Self-pay | Admitting: Pulmonary Disease

## 2021-10-21 VITALS — BP 142/80 | HR 82 | Ht 63.0 in | Wt 172.4 lb

## 2021-10-21 DIAGNOSIS — J9611 Chronic respiratory failure with hypoxia: Secondary | ICD-10-CM

## 2021-10-21 DIAGNOSIS — R0982 Postnasal drip: Secondary | ICD-10-CM

## 2021-10-21 DIAGNOSIS — J439 Emphysema, unspecified: Secondary | ICD-10-CM | POA: Diagnosis not present

## 2021-10-21 DIAGNOSIS — J9690 Respiratory failure, unspecified, unspecified whether with hypoxia or hypercapnia: Secondary | ICD-10-CM | POA: Diagnosis not present

## 2021-10-21 DIAGNOSIS — J449 Chronic obstructive pulmonary disease, unspecified: Secondary | ICD-10-CM | POA: Diagnosis not present

## 2021-10-21 MED ORDER — FLUTICASONE PROPIONATE 50 MCG/ACT NA SUSP: 1.0000 | Freq: Every day | NASAL | 2 refills | Status: AC

## 2021-10-21 MED ORDER — IPRATROPIUM BROMIDE 0.03 % NA SOLN: 2.0000 | Freq: Two times a day (BID) | NASAL | 12 refills | Status: AC

## 2021-10-21 NOTE — Patient Instructions (Addendum)
Continue using trelegy ellipta 1 puff daily ? ?Continue albuterol inhaler or nebulizer treatments as needed ? ?Start fluticasone nasal spray 2 sprays per nostril ? ?Start ipratropium nasal spray 2 sprays per nostril twice daily ? ?Stop azelastine nasal spray ? ?We place an order for your DME company to evaluate you for a portable oxygen concentrator.  ? ?Follow up in 6 months ? ? ? ? ?

## 2021-10-21 NOTE — Progress Notes (Signed)
? ?Synopsis: Referred in March 2023 for COPD by [name], MD ? ?Subjective:  ? ?PATIENT ID: Susan Ross GENDER: female DOB: 1929/07/23, MRN: [id number] ? ?HPI ? ?Chief Complaint  ?Patient presents with  ? Consult  ?  Referred by PCP for history of COPD. Currently on 2L of O2.   ? ?Susan Ross is a 88 year old woman, former smoker with hypertension, chronic systolic heart failure, and coronary artery disease who is referred to pulmonary clinic for evaluation of COPD.  ? ?She was admitted 05/09/21 for exacerbation of COPD and stress induced cardiomyopathy. She was discharged on supplemental oxygen as she has SpO2 desaturations into the 80s on room air with ambulation. She is requesting a portable oxygen concentrator. ?  ?Cardiology note 09/02/21 reviewed.  ? ?She reports doing well since her hospitalization. She is currently using trelegy with good response. She is using albuterol inhaler and nebulizer treatments as needed. She does have dyspnea when getting dressed or bathing herself. ? ?She complains of nasal congestion and post-nasal drainage. She is using azelastine nasal spray. She denies seasonal allergies. She denies GERD. ? ?She quit smoking in 1985. She smoked for 40 years prior to quitting. 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 Denies second hand smoke exposure in childhood. ? ?Quit smoking in 1985. Started at age 77. Work at [employer] for 32 years. Around lots of dust. No second hand smoke in childhood.  ? ?Past Medical History:  ?Diagnosis Date  ? Hypertension   ? Osteoarthritis   ?  ? ?Family History  ?Problem Relation Age of Onset  ? Cancer Mother   ? Congestive Heart Failure Father   ?  ? ?Social History  ? ?Socioeconomic History  ? Marital status: Single  ?  Spouse name: Not on file  ? Number of children: Not on file  ? Years of education: Not on file  ? Highest education level: Not on file  ?Occupational History  ? Not on file   ?Tobacco Use  ? Smoking status: Former  ?  Passive exposure: Past  ? Smokeless tobacco: Never  ?Substance and Sexual Activity  ? Alcohol use: Not Currently  ? Drug use: Not on file  ? Sexual activity: Not on file  ?Other Topics Concern  ? Not on file  ?Social History Narrative  ? Not on file  ? ?Social Determinants of Health  ? ?Financial Resource Strain: Not on file  ?Food Insecurity: Not on file  ?Transportation Needs: Not on file  ?Physical Activity: Not on file  ?Stress: Not on file  ?Social Connections: Not on file  ?Intimate Partner Violence: Not on file  ?  ? ?No Known Allergies  ? ?Outpatient Medications Prior to Visit  ?Medication Sig Dispense Refill  ? albuterol (VENTOLIN HFA) 108 (90 Base) MCG/ACT inhaler SMARTSIG:1 Puff(s) By Mouth Every 4 Hours PRN    ? amLODipine (NORVASC) 5 MG tablet Take 5 mg by mouth daily.    ? aspirin 81 MG chewable tablet Chew 81 mg by mouth daily.    ? atorvastatin (LIPITOR) 80 MG tablet Take by mouth.    ? carvedilol (COREG) 25 MG tablet Take by mouth.    ? cetirizine (ZYRTEC) 10 MG tablet Take 10 mg by mouth daily.    ? furosemide (LASIX) 20 MG tablet Take 20 mg by mouth daily as needed.    ? gabapentin (NEURONTIN) 300 MG capsule  Take 300 mg by mouth at bedtime.    ? hydrALAZINE (APRESOLINE) 50 MG tablet Take 50 mg by mouth 3 (three) times daily.    ? isosorbide dinitrate (ISORDIL) 10 MG tablet Take 10 mg by mouth 3 (three) times daily.    ? labetalol (NORMODYNE) 300 MG tablet Take 300 mg by mouth 2 (two) times daily.    ? TRELEGY ELLIPTA 100-62.5-25 MCG/ACT AEPB Take 1 puff by mouth daily.    ? Azelastine HCl 137 MCG/SPRAY SOLN Place 1 spray into both nostrils 2 (two) times daily.    ? HYDROcodone-acetaminophen (NORCO/VICODIN) 5-325 MG tablet Take 1 tablet by mouth 2 (two) times daily. (Patient not taking: Reported on 02/16/2020)    ? ?No facility-administered medications prior to visit.  ? ?Review of Systems  ?Constitutional:  Negative for chills, fever, malaise/fatigue and  weight loss.  ?HENT:  Positive for congestion. Negative for sinus pain and sore throat.   ?Eyes: Negative.   ?Respiratory:  Positive for shortness of breath. Negative for cough, hemoptysis, sputum production and wheezing.   ?Cardiovascular:  Positive for chest pain. Negative for palpitations, orthopnea, claudication and leg swelling.  ?Gastrointestinal:  Negative for abdominal pain, heartburn, nausea and vomiting.  ?Genitourinary: Negative.   ?Musculoskeletal:  Negative for joint pain and myalgias.  ?Skin:  Negative for rash.  ?Neurological:  Negative for weakness.  ?Endo/Heme/Allergies: Negative.   ?Psychiatric/Behavioral:  The patient is nervous/anxious.   ? ?Objective:  ? ?Vitals:  ? 10/21/21 1015  ?BP: (!) 142/80  ?Pulse: 82  ?SpO2: 99%  ?Weight: 172 lb 6.4 oz (78.2 kg)  ?Height: 5\' 3"  (1.6 m)  ? ? ?Physical Exam ?Constitutional:   ?   General: She is not in acute distress. ?   Appearance: She is not ill-appearing.  ?HENT:  ?   Head: Normocephalic and atraumatic.  ?Eyes:  ?   General: No scleral icterus. ?   Conjunctiva/sclera: Conjunctivae normal.  ?   Pupils: Pupils are equal, round, and reactive to light.  ?Cardiovascular:  ?   Rate and Rhythm: Normal rate and regular rhythm.  ?   Pulses: Normal pulses.  ?   Heart sounds: Normal heart sounds. No murmur heard. ?Pulmonary:  ?   Effort: Pulmonary effort is normal.  ?   Breath sounds: Normal breath sounds. No wheezing, rhonchi or rales.  ?Abdominal:  ?   General: Bowel sounds are normal.  ?   Palpations: Abdomen is soft.  ?Musculoskeletal:  ?   Right lower leg: No edema.  ?   Left lower leg: No edema.  ?Lymphadenopathy:  ?   Cervical: No cervical adenopathy.  ?Skin: ?   General: Skin is warm and dry.  ?Neurological:  ?   General: No focal deficit present.  ?   Mental Status: She is alert.  ?Psychiatric:     ?   Mood and Affect: Mood normal.     ?   Behavior: Behavior normal.     ?   Thought Content: Thought content normal.     ?   Judgment: Judgment normal.   ? ? ?CBC ?No results found for: WBC, RBC, HGB, HCT, PLT, MCV, MCH, MCHC, RDW, LYMPHSABS, MONOABS, EOSABS, BASOSABS ? ? ?Chest imaging: ?CXR 05/10/21 ?1. Mild bibasilar opacities may represent atelectasis, aspiration or infection. ? ?CXR 05/08/21 ?1. Bilateral airspace disease greatest at the right base, consistent  ?with multifocal pneumonia or edema.  ? ? ?PFT: ?   ? View : No data to display.  ?  ?  ?  ? ? ?  Labs: ? ?Path: ? ?Echo 04/16/21: ?1. The left ventricle is normal in size with normal wall thickness.  ?  2. The left ventricular systolic function is normal, LVEF is visually  ?estimated at > 55%.  ?  3. There is grade I diastolic dysfunction (impaired relaxation).  ?  4. The left atrium is mildly to moderately dilated in size.  ?  5. The right ventricle is normal in size, with normal systolic function.  ?  6. There is insufficient TR signal pulmonary hypertension.  ? ?Heart Catheterization: ? ?Assessment & Plan:  ? ?Chronic respiratory failure with hypoxia (HCC) - Plan: DG Chest 2 View ? ?Post-nasal drainage - Plan: fluticasone (FLONASE) 50 MCG/ACT nasal spray, ipratropium (ATROVENT) 0.03 % nasal spray ? ?Discussion: ?Raveena Pearce is a 88 year old woman, former smoker with hypertension, chronic systolic heart failure, chronic hypoxemic respiratory failure and coronary artery disease who is referred to pulmonary clinic for evaluation of COPD. ? ?She likely has COPD given her significant smoking history and work exposure to wood dusts and glue products throughout her career. ? ?She is to continue trelegy ellipta 1 puff daily and as needed albuterol inhaler or nebulizer treatments.  ? ?We will place an order for portable oxygen concentrator.  ? ?For her sinus congestion and drainage, she is to start fluticasone nasal spray and ipratropium nasal spray. She can stop azelastine nasal spray. ? ?Follow up in 6 months.  ? ?Freda Jackson, MD ?[facility] ?Office: [phone] ? ? ? ?Current  Outpatient Medications:  ?  albuterol (VENTOLIN HFA) 108 (90 Base) MCG/ACT inhaler, SMARTSIG:1 Puff(s) By Mouth Every 4 Hours PRN, Disp: , Rfl:  ?  amLODipine (NORVASC) 5 MG tablet, Take 5 mg by mouth daily., Dis

## 2021-10-24 ENCOUNTER — Encounter: Payer: Self-pay | Admitting: Pulmonary Disease

## 2021-10-25 DIAGNOSIS — J449 Chronic obstructive pulmonary disease, unspecified: Secondary | ICD-10-CM | POA: Diagnosis not present

## 2021-10-25 DIAGNOSIS — I5032 Chronic diastolic (congestive) heart failure: Secondary | ICD-10-CM | POA: Diagnosis not present

## 2021-10-25 DIAGNOSIS — N1832 Chronic kidney disease, stage 3b: Secondary | ICD-10-CM | POA: Diagnosis not present

## 2021-10-25 DIAGNOSIS — E785 Hyperlipidemia, unspecified: Secondary | ICD-10-CM | POA: Diagnosis not present

## 2021-10-25 DIAGNOSIS — I10 Essential (primary) hypertension: Secondary | ICD-10-CM | POA: Diagnosis not present

## 2021-10-28 ENCOUNTER — Telehealth: Payer: Self-pay | Admitting: Pulmonary Disease

## 2021-10-28 NOTE — Telephone Encounter (Signed)
Dr Francine Graven please advise : ? ?Patient is requesting CXR results from 10/21/21.  ?

## 2021-10-28 NOTE — Telephone Encounter (Signed)
Called and spoke to patient about test results. Patient has a clear understanding. Nothing further needed.  ?

## 2021-11-11 DIAGNOSIS — I5181 Takotsubo syndrome: Secondary | ICD-10-CM | POA: Diagnosis not present

## 2021-11-11 DIAGNOSIS — J449 Chronic obstructive pulmonary disease, unspecified: Secondary | ICD-10-CM | POA: Diagnosis not present

## 2021-11-21 DIAGNOSIS — J449 Chronic obstructive pulmonary disease, unspecified: Secondary | ICD-10-CM | POA: Diagnosis not present

## 2021-11-21 DIAGNOSIS — N1832 Chronic kidney disease, stage 3b: Secondary | ICD-10-CM | POA: Diagnosis not present

## 2021-11-21 DIAGNOSIS — E785 Hyperlipidemia, unspecified: Secondary | ICD-10-CM | POA: Diagnosis not present

## 2021-11-21 DIAGNOSIS — I10 Essential (primary) hypertension: Secondary | ICD-10-CM | POA: Diagnosis not present

## 2021-11-21 DIAGNOSIS — I5032 Chronic diastolic (congestive) heart failure: Secondary | ICD-10-CM | POA: Diagnosis not present

## 2021-11-21 DIAGNOSIS — J440 Chronic obstructive pulmonary disease with acute lower respiratory infection: Secondary | ICD-10-CM | POA: Diagnosis not present

## 2021-11-21 DIAGNOSIS — Z6829 Body mass index (BMI) 29.0-29.9, adult: Secondary | ICD-10-CM | POA: Diagnosis not present

## 2021-12-11 DIAGNOSIS — J449 Chronic obstructive pulmonary disease, unspecified: Secondary | ICD-10-CM | POA: Diagnosis not present

## 2021-12-11 DIAGNOSIS — I5181 Takotsubo syndrome: Secondary | ICD-10-CM | POA: Diagnosis not present

## 2021-12-16 ENCOUNTER — Telehealth: Payer: Self-pay | Admitting: Pulmonary Disease

## 2021-12-17 NOTE — Telephone Encounter (Signed)
Called and left voicemail for Malachi Bonds to call office back in regards to POC order that was placed in march. I gave her the number of the DME company that received the order:  DME: [facility] Ph: [phone] Fax: [phone]

## 2021-12-25 DIAGNOSIS — E785 Hyperlipidemia, unspecified: Secondary | ICD-10-CM | POA: Diagnosis not present

## 2021-12-25 DIAGNOSIS — I10 Essential (primary) hypertension: Secondary | ICD-10-CM | POA: Diagnosis not present

## 2021-12-25 DIAGNOSIS — N1832 Chronic kidney disease, stage 3b: Secondary | ICD-10-CM | POA: Diagnosis not present

## 2021-12-25 DIAGNOSIS — J449 Chronic obstructive pulmonary disease, unspecified: Secondary | ICD-10-CM | POA: Diagnosis not present

## 2022-01-01 DIAGNOSIS — J449 Chronic obstructive pulmonary disease, unspecified: Secondary | ICD-10-CM | POA: Diagnosis not present

## 2022-01-01 DIAGNOSIS — E785 Hyperlipidemia, unspecified: Secondary | ICD-10-CM | POA: Diagnosis not present

## 2022-01-01 DIAGNOSIS — I10 Essential (primary) hypertension: Secondary | ICD-10-CM | POA: Diagnosis not present

## 2022-01-01 DIAGNOSIS — Z6829 Body mass index (BMI) 29.0-29.9, adult: Secondary | ICD-10-CM | POA: Diagnosis not present

## 2022-01-01 DIAGNOSIS — N1832 Chronic kidney disease, stage 3b: Secondary | ICD-10-CM | POA: Diagnosis not present

## 2022-01-11 DIAGNOSIS — J449 Chronic obstructive pulmonary disease, unspecified: Secondary | ICD-10-CM | POA: Diagnosis not present

## 2022-01-11 DIAGNOSIS — I5181 Takotsubo syndrome: Secondary | ICD-10-CM | POA: Diagnosis not present

## 2022-02-10 DIAGNOSIS — I5181 Takotsubo syndrome: Secondary | ICD-10-CM | POA: Diagnosis not present

## 2022-03-04 DIAGNOSIS — I2510 Atherosclerotic heart disease of native coronary artery without angina pectoris: Secondary | ICD-10-CM | POA: Diagnosis not present

## 2022-03-04 DIAGNOSIS — I10 Essential (primary) hypertension: Secondary | ICD-10-CM | POA: Diagnosis not present

## 2022-03-04 DIAGNOSIS — I5020 Unspecified systolic (congestive) heart failure: Secondary | ICD-10-CM | POA: Diagnosis not present

## 2022-03-13 DIAGNOSIS — I5181 Takotsubo syndrome: Secondary | ICD-10-CM | POA: Diagnosis not present

## 2022-03-27 DIAGNOSIS — E785 Hyperlipidemia, unspecified: Secondary | ICD-10-CM | POA: Diagnosis not present

## 2022-03-27 DIAGNOSIS — J449 Chronic obstructive pulmonary disease, unspecified: Secondary | ICD-10-CM | POA: Diagnosis not present

## 2022-03-27 DIAGNOSIS — N1832 Chronic kidney disease, stage 3b: Secondary | ICD-10-CM | POA: Diagnosis not present

## 2022-03-27 DIAGNOSIS — I10 Essential (primary) hypertension: Secondary | ICD-10-CM | POA: Diagnosis not present

## 2022-04-13 DIAGNOSIS — I5181 Takotsubo syndrome: Secondary | ICD-10-CM | POA: Diagnosis not present

## 2022-05-13 DIAGNOSIS — I5181 Takotsubo syndrome: Secondary | ICD-10-CM | POA: Diagnosis not present

## 2022-05-19 DIAGNOSIS — H40013 Open angle with borderline findings, low risk, bilateral: Secondary | ICD-10-CM | POA: Diagnosis not present

## 2022-05-26 DIAGNOSIS — I5181 Takotsubo syndrome: Secondary | ICD-10-CM | POA: Diagnosis not present

## 2022-06-13 DIAGNOSIS — J449 Chronic obstructive pulmonary disease, unspecified: Secondary | ICD-10-CM | POA: Diagnosis not present

## 2022-06-13 DIAGNOSIS — I5181 Takotsubo syndrome: Secondary | ICD-10-CM | POA: Diagnosis not present

## 2022-06-26 DIAGNOSIS — I5181 Takotsubo syndrome: Secondary | ICD-10-CM | POA: Diagnosis not present

## 2022-06-30 DIAGNOSIS — E785 Hyperlipidemia, unspecified: Secondary | ICD-10-CM | POA: Diagnosis not present

## 2022-06-30 DIAGNOSIS — N1832 Chronic kidney disease, stage 3b: Secondary | ICD-10-CM | POA: Diagnosis not present

## 2022-06-30 DIAGNOSIS — J449 Chronic obstructive pulmonary disease, unspecified: Secondary | ICD-10-CM | POA: Diagnosis not present

## 2022-06-30 DIAGNOSIS — I10 Essential (primary) hypertension: Secondary | ICD-10-CM | POA: Diagnosis not present

## 2022-06-30 DIAGNOSIS — R0602 Shortness of breath: Secondary | ICD-10-CM | POA: Diagnosis not present

## 2022-07-13 DIAGNOSIS — I5181 Takotsubo syndrome: Secondary | ICD-10-CM | POA: Diagnosis not present

## 2022-07-13 DIAGNOSIS — J449 Chronic obstructive pulmonary disease, unspecified: Secondary | ICD-10-CM | POA: Diagnosis not present

## 2022-07-26 DIAGNOSIS — I5181 Takotsubo syndrome: Secondary | ICD-10-CM | POA: Diagnosis not present

## 2022-07-29 DIAGNOSIS — N281 Cyst of kidney, acquired: Secondary | ICD-10-CM | POA: Diagnosis not present

## 2022-07-29 DIAGNOSIS — R109 Unspecified abdominal pain: Secondary | ICD-10-CM | POA: Diagnosis not present

## 2022-08-13 DIAGNOSIS — I5181 Takotsubo syndrome: Secondary | ICD-10-CM | POA: Diagnosis not present

## 2022-08-13 DIAGNOSIS — J449 Chronic obstructive pulmonary disease, unspecified: Secondary | ICD-10-CM | POA: Diagnosis not present

## 2022-08-15 DIAGNOSIS — K862 Cyst of pancreas: Secondary | ICD-10-CM | POA: Diagnosis not present

## 2022-08-15 DIAGNOSIS — I700 Atherosclerosis of aorta: Secondary | ICD-10-CM | POA: Diagnosis not present

## 2022-08-15 DIAGNOSIS — N189 Chronic kidney disease, unspecified: Secondary | ICD-10-CM | POA: Diagnosis not present

## 2022-08-15 DIAGNOSIS — N261 Atrophy of kidney (terminal): Secondary | ICD-10-CM | POA: Diagnosis not present

## 2022-08-15 DIAGNOSIS — K449 Diaphragmatic hernia without obstruction or gangrene: Secondary | ICD-10-CM | POA: Diagnosis not present

## 2022-08-15 DIAGNOSIS — N281 Cyst of kidney, acquired: Secondary | ICD-10-CM | POA: Diagnosis not present

## 2022-08-22 DIAGNOSIS — I5181 Takotsubo syndrome: Secondary | ICD-10-CM | POA: Diagnosis not present

## 2022-08-26 DIAGNOSIS — I5181 Takotsubo syndrome: Secondary | ICD-10-CM | POA: Diagnosis not present

## 2022-09-03 DIAGNOSIS — K449 Diaphragmatic hernia without obstruction or gangrene: Secondary | ICD-10-CM | POA: Diagnosis not present

## 2022-09-03 DIAGNOSIS — N1330 Unspecified hydronephrosis: Secondary | ICD-10-CM | POA: Diagnosis not present

## 2022-09-03 DIAGNOSIS — N261 Atrophy of kidney (terminal): Secondary | ICD-10-CM | POA: Diagnosis not present

## 2022-09-03 DIAGNOSIS — Z66 Do not resuscitate: Secondary | ICD-10-CM | POA: Diagnosis not present

## 2022-09-03 DIAGNOSIS — K862 Cyst of pancreas: Secondary | ICD-10-CM | POA: Diagnosis not present

## 2022-09-10 DIAGNOSIS — I10 Essential (primary) hypertension: Secondary | ICD-10-CM | POA: Diagnosis not present

## 2022-09-10 DIAGNOSIS — I5020 Unspecified systolic (congestive) heart failure: Secondary | ICD-10-CM | POA: Diagnosis not present

## 2022-09-11 ENCOUNTER — Telehealth: Payer: Self-pay | Admitting: Gastroenterology

## 2022-09-11 DIAGNOSIS — I151 Hypertension secondary to other renal disorders: Secondary | ICD-10-CM | POA: Diagnosis not present

## 2022-09-11 NOTE — Telephone Encounter (Signed)
Good Morning Dr.Mansouraty,  We received a referral on this patient to have an EUS. Records are available for you to review.  Please advise on scheduling, thank you.

## 2022-09-11 NOTE — Telephone Encounter (Signed)
This patient's chart will be reviewed within the next week when I return and have a clinic day.

## 2022-09-13 DIAGNOSIS — I5181 Takotsubo syndrome: Secondary | ICD-10-CM | POA: Diagnosis not present

## 2022-09-13 DIAGNOSIS — J449 Chronic obstructive pulmonary disease, unspecified: Secondary | ICD-10-CM | POA: Diagnosis not present

## 2022-09-14 NOTE — Telephone Encounter (Signed)
Review of outside records that have been scanned into the media tab/referral  February 2024 clinic visit with Dr. Velvet Bathe The patient was having headaches as well as some noted recent blood pressure elevations with what seems to be plan for medication adjustments  12/23 labs Sodium 143 Potassium 4.2 BUN/creatinine 18/1.55 TSH 2.2 Iron/TIBC equal 78/209 Iron saturation 27% Ferritin 47 Total cholesterol 184 Triglycerides 72 LDL 88 AST/ALT 17/11 Alk phos 88 T. bili 0.7 NT BNP 1189  January 2024 ultrasound Shows bilateral renal cyst The right kidney is asymmetrically with very echogenic compared to the left with possibility of renal artery stenosis Cystic lesions in the pancreas with evidence of pancreatic duct dilation.  These are presumably changes related to chronic pancreatitis.  Pancreas protocol CT could be done to further evaluate Unremarkable postcholecystectomy appearance of the abdomen  January 2024 CT AP No acute intra-abdominal or pelvic pathology Moderate size hiatal hernia Multiple cystic lesions involving the head and proximal body of the pancreas, suboptimally evaluated on the CT, possibly sidebranch IPMN.  Further characterization with MRI without and with contrast recommended. Atrophic right kidney.  No hydronephrosis. Aortic atherosclerosis  February 2024 MRI abdomen with and without contrast 1. Multiple pancreatic cystic lesions the largest of which is in the pancreatic head measuring 2.3 cm with a somewhat tubular appearance possibly reflecting a dorsal ductal IPMN. None of the cystic lesions demonstrate convincing evidence of postcontrast enhancement or wall/septal thickening. No ventral pancreatic ductal dilation. Given the size of the largest pancreatic lesion consider further  evaluation with EUS/fluid sampling. Otherwise recommend follow-up  MRCP with and without contrast in 6 months.  2. Mild bilateral hydronephrosis possibly secondary to back pressure from  the urinary bladder. However, it is nonspecific and poorly evaluated on this motion degraded examination. Consider further evaluation with renal ultrasound.  3. Similar right renal atrophy.  4. Multiple bilateral renal lesions the largest of which do not demonstrate postcontrast enhancement and are compatible with benign cysts the smaller of which are poorly evaluated secondary to respiratory motion and while favored to reflect cysts are technically indeterminate. Suggest attention on follow-up imaging  5. Moderate-sized hiatal hernia.   Unfortunately I cannot visualize the images themselves because they are in a different system. I do think it is reasonable for [place] to first see this patient who is 89 years of age and discussed with her the next steps of evaluation.  I usually do not proceed with endoscopic ultrasound unless a pancreatic cyst has noted to be greater than 3 cm or has other concerning findings.  As well the imaging report suggest that there is no pancreatic duct dilation.  Thus I think the role of endoscopic ultrasound should be made only if there is significant symptoms or other issues that are at play right now until we can see the longevity of these particular findings.  I think MRI/MRCP repeat in 4 to 6 months makes the most sense but I would like to have the opportunity to talk with her if she wishes as the risks of endoscopic ultrasound and anesthesia may outweigh the benefits at this point.  Please offer this patient a clinic visit with myself so that we may discuss next steps in evaluation.  Please also work on getting the images power shared from [facility] to our system.  Please let the referring provider know my thoughts.  Justice Britain, MD [facility] # [phone]

## 2022-09-15 ENCOUNTER — Ambulatory Visit
Admission: RE | Admit: 2022-09-15 | Discharge: 2022-09-15 | Disposition: A | Discharge status: Home / Self Care | Payer: Self-pay | Source: Ambulatory Visit | Attending: Gastroenterology | Admitting: Gastroenterology

## 2022-09-15 ENCOUNTER — Other Ambulatory Visit: Payer: Self-pay

## 2022-09-15 DIAGNOSIS — R9389 Abnormal findings on diagnostic imaging of other specified body structures: Secondary | ICD-10-CM

## 2022-09-15 NOTE — Telephone Encounter (Signed)
Images to be power shared. Email request sent to [facility].  Appt made for the pt to see Dr Rush Landmark on 10/28/22 at 2:30 pm. No answer no voice mail

## 2022-09-16 NOTE — Telephone Encounter (Signed)
No answer no voice mail  

## 2022-09-17 NOTE — Telephone Encounter (Signed)
No answer no voice mail  

## 2022-09-17 NOTE — Telephone Encounter (Signed)
Attempted to reach pt again and line rings fast busy

## 2022-09-17 NOTE — Telephone Encounter (Signed)
Letter mailed with appt information  Unable to reach pt by phone

## 2022-09-25 DIAGNOSIS — I5181 Takotsubo syndrome: Secondary | ICD-10-CM | POA: Diagnosis not present

## 2022-09-28 DIAGNOSIS — S01511A Laceration without foreign body of lip, initial encounter: Secondary | ICD-10-CM | POA: Diagnosis not present

## 2022-09-28 DIAGNOSIS — I509 Heart failure, unspecified: Secondary | ICD-10-CM | POA: Diagnosis not present

## 2022-09-28 DIAGNOSIS — Z7951 Long term (current) use of inhaled steroids: Secondary | ICD-10-CM | POA: Diagnosis not present

## 2022-09-28 DIAGNOSIS — Z7982 Long term (current) use of aspirin: Secondary | ICD-10-CM | POA: Diagnosis not present

## 2022-09-28 DIAGNOSIS — J449 Chronic obstructive pulmonary disease, unspecified: Secondary | ICD-10-CM | POA: Diagnosis not present

## 2022-09-28 DIAGNOSIS — Z87891 Personal history of nicotine dependence: Secondary | ICD-10-CM | POA: Diagnosis not present

## 2022-09-28 DIAGNOSIS — Z79899 Other long term (current) drug therapy: Secondary | ICD-10-CM | POA: Diagnosis not present

## 2022-09-28 DIAGNOSIS — W268XXA Contact with other sharp object(s), not elsewhere classified, initial encounter: Secondary | ICD-10-CM | POA: Diagnosis not present

## 2022-09-28 DIAGNOSIS — I110 Hypertensive heart disease with heart failure: Secondary | ICD-10-CM | POA: Diagnosis not present

## 2022-10-08 DIAGNOSIS — I5032 Chronic diastolic (congestive) heart failure: Secondary | ICD-10-CM | POA: Diagnosis not present

## 2022-10-08 DIAGNOSIS — I151 Hypertension secondary to other renal disorders: Secondary | ICD-10-CM | POA: Diagnosis not present

## 2022-10-12 DIAGNOSIS — J449 Chronic obstructive pulmonary disease, unspecified: Secondary | ICD-10-CM | POA: Diagnosis not present

## 2022-10-12 DIAGNOSIS — I5181 Takotsubo syndrome: Secondary | ICD-10-CM | POA: Diagnosis not present

## 2022-10-16 DIAGNOSIS — I952 Hypotension due to drugs: Secondary | ICD-10-CM | POA: Diagnosis not present

## 2022-10-20 ENCOUNTER — Emergency Department (HOSPITAL_COMMUNITY): Payer: 59

## 2022-10-20 ENCOUNTER — Other Ambulatory Visit: Payer: Self-pay

## 2022-10-20 ENCOUNTER — Emergency Department (HOSPITAL_COMMUNITY)
Admission: EM | Admit: 2022-10-20 | Discharge: 2022-10-20 | Disposition: A | Discharge status: Home / Self Care | Payer: 59 | Attending: Emergency Medicine | Admitting: Emergency Medicine

## 2022-10-20 ENCOUNTER — Encounter (HOSPITAL_COMMUNITY): Payer: Self-pay | Admitting: *Deleted

## 2022-10-20 DIAGNOSIS — Z79899 Other long term (current) drug therapy: Secondary | ICD-10-CM | POA: Insufficient documentation

## 2022-10-20 DIAGNOSIS — J9811 Atelectasis: Secondary | ICD-10-CM | POA: Diagnosis not present

## 2022-10-20 DIAGNOSIS — J441 Chronic obstructive pulmonary disease with (acute) exacerbation: Secondary | ICD-10-CM | POA: Insufficient documentation

## 2022-10-20 DIAGNOSIS — Z7982 Long term (current) use of aspirin: Secondary | ICD-10-CM | POA: Diagnosis not present

## 2022-10-20 DIAGNOSIS — I130 Hypertensive heart and chronic kidney disease with heart failure and stage 1 through stage 4 chronic kidney disease, or unspecified chronic kidney disease: Secondary | ICD-10-CM | POA: Diagnosis not present

## 2022-10-20 DIAGNOSIS — N189 Chronic kidney disease, unspecified: Secondary | ICD-10-CM | POA: Insufficient documentation

## 2022-10-20 DIAGNOSIS — Z1152 Encounter for screening for COVID-19: Secondary | ICD-10-CM | POA: Diagnosis not present

## 2022-10-20 DIAGNOSIS — I509 Heart failure, unspecified: Secondary | ICD-10-CM | POA: Diagnosis not present

## 2022-10-20 DIAGNOSIS — R0789 Other chest pain: Secondary | ICD-10-CM | POA: Diagnosis not present

## 2022-10-20 DIAGNOSIS — R0602 Shortness of breath: Secondary | ICD-10-CM | POA: Diagnosis not present

## 2022-10-20 DIAGNOSIS — R410 Disorientation, unspecified: Secondary | ICD-10-CM | POA: Diagnosis not present

## 2022-10-20 DIAGNOSIS — J324 Chronic pansinusitis: Secondary | ICD-10-CM | POA: Insufficient documentation

## 2022-10-20 DIAGNOSIS — R0600 Dyspnea, unspecified: Secondary | ICD-10-CM | POA: Diagnosis not present

## 2022-10-20 DIAGNOSIS — R519 Headache, unspecified: Secondary | ICD-10-CM | POA: Insufficient documentation

## 2022-10-20 DIAGNOSIS — J439 Emphysema, unspecified: Secondary | ICD-10-CM | POA: Diagnosis not present

## 2022-10-20 DIAGNOSIS — Z7951 Long term (current) use of inhaled steroids: Secondary | ICD-10-CM | POA: Insufficient documentation

## 2022-10-20 DIAGNOSIS — I10 Essential (primary) hypertension: Secondary | ICD-10-CM | POA: Diagnosis not present

## 2022-10-20 DIAGNOSIS — I201 Angina pectoris with documented spasm: Secondary | ICD-10-CM | POA: Diagnosis not present

## 2022-10-20 DIAGNOSIS — R079 Chest pain, unspecified: Secondary | ICD-10-CM | POA: Diagnosis not present

## 2022-10-20 LAB — TROPONIN I (HIGH SENSITIVITY)
Troponin I (High Sensitivity): 11 ng/L (ref <18)
Troponin I (High Sensitivity): 11 ng/L (ref <18)

## 2022-10-20 LAB — CBC WITH DIFFERENTIAL/PLATELET
Abs Immature Granulocytes: 0.02 10*3/uL (ref 0.00–0.07)
Basophils Absolute: 0.1 10*3/uL (ref 0.0–0.1)
Basophils Relative: 1 %
Eosinophils Absolute: 0.2 10*3/uL (ref 0.0–0.5)
Eosinophils Relative: 2 %
HCT: 32.4 % — ABNORMAL LOW (ref 36.0–46.0)
Hemoglobin: 10.1 g/dL — ABNORMAL LOW (ref 12.0–15.0)
Immature Granulocytes: 0 %
Lymphocytes Relative: 20 %
Lymphs Abs: 1.6 10*3/uL (ref 0.7–4.0)
MCH: 30.1 pg (ref 26.0–34.0)
MCHC: 31.2 g/dL (ref 30.0–36.0)
MCV: 96.7 fL (ref 80.0–100.0)
Monocytes Absolute: 0.9 10*3/uL (ref 0.1–1.0)
Monocytes Relative: 11 %
Neutro Abs: 5.3 10*3/uL (ref 1.7–7.7)
Neutrophils Relative %: 66 %
Platelets: 228 10*3/uL (ref 150–400)
RBC: 3.35 MIL/uL — ABNORMAL LOW (ref 3.87–5.11)
RDW: 14.4 % (ref 11.5–15.5)
WBC: 8 10*3/uL (ref 4.0–10.5)
nRBC: 0 % (ref 0.0–0.2)

## 2022-10-20 LAB — RESP PANEL BY RT-PCR (RSV, FLU A&B, COVID)  RVPGX2
Influenza A by PCR: NEGATIVE
Influenza B by PCR: NEGATIVE
Resp Syncytial Virus by PCR: NEGATIVE
SARS Coronavirus 2 by RT PCR: NEGATIVE

## 2022-10-20 LAB — COMPREHENSIVE METABOLIC PANEL
ALT: 24 U/L (ref 0–44)
AST: 43 U/L — ABNORMAL HIGH (ref 15–41)
Albumin: 3.5 g/dL (ref 3.5–5.0)
Alkaline Phosphatase: 80 U/L (ref 38–126)
Anion gap: 7 (ref 5–15)
BUN: 40 mg/dL — ABNORMAL HIGH (ref 8–23)
CO2: 25 mmol/L (ref 22–32)
Calcium: 8.9 mg/dL (ref 8.9–10.3)
Chloride: 106 mmol/L (ref 98–111)
Creatinine, Ser: 1.67 mg/dL — ABNORMAL HIGH (ref 0.44–1.00)
GFR, Estimated: 29 mL/min — ABNORMAL LOW (ref >60)
Glucose, Bld: 115 mg/dL — ABNORMAL HIGH (ref 70–99)
Potassium: 5.2 mmol/L — ABNORMAL HIGH (ref 3.5–5.1)
Sodium: 138 mmol/L (ref 135–145)
Total Bilirubin: 0.8 mg/dL (ref 0.3–1.2)
Total Protein: 7.3 g/dL (ref 6.5–8.1)

## 2022-10-20 LAB — URINALYSIS, ROUTINE W REFLEX MICROSCOPIC
Bilirubin Urine: NEGATIVE
Glucose, UA: 150 mg/dL — AB
Hgb urine dipstick: NEGATIVE
Ketones, ur: NEGATIVE mg/dL
Nitrite: NEGATIVE
Protein, ur: NEGATIVE mg/dL
Specific Gravity, Urine: 1.013 (ref 1.005–1.030)
pH: 5 (ref 5.0–8.0)

## 2022-10-20 LAB — PROTIME-INR
INR: 1.1 (ref 0.8–1.2)
Prothrombin Time: 13.7 seconds (ref 11.4–15.2)

## 2022-10-20 LAB — BRAIN NATRIURETIC PEPTIDE: B Natriuretic Peptide: 892 pg/mL — ABNORMAL HIGH (ref 0.0–100.0)

## 2022-10-20 LAB — MAGNESIUM: Magnesium: 2.1 mg/dL (ref 1.7–2.4)

## 2022-10-20 LAB — LIPASE, BLOOD: Lipase: 65 U/L — ABNORMAL HIGH (ref 11–51)

## 2022-10-20 MED ORDER — SODIUM ZIRCONIUM CYCLOSILICATE 5 G PO PACK
10.0000 g | PACK | Freq: Once | ORAL | Status: AC
  Administered 2022-10-20: 10 g via ORAL
  Filled 2022-10-20: qty 2

## 2022-10-20 MED ORDER — SODIUM CHLORIDE 0.9 % IV BOLUS
250.0000 mL | Freq: Once | INTRAVENOUS | Status: AC
  Administered 2022-10-20: 250 mL via INTRAVENOUS

## 2022-10-20 MED ORDER — AMOXICILLIN-POT CLAVULANATE 875-125 MG PO TABS
1.0000 | ORAL_TABLET | Freq: Once | ORAL | Status: AC
  Administered 2022-10-20: 1 via ORAL
  Filled 2022-10-20: qty 1

## 2022-10-20 MED ORDER — DOXYCYCLINE HYCLATE 100 MG PO CAPS: 100.0000 mg | ORAL_CAPSULE | Freq: Two times a day (BID) | ORAL | 0 refills | Status: AC

## 2022-10-20 MED ORDER — IRBESARTAN 150 MG PO TABS
150.0000 mg | ORAL_TABLET | Freq: Every day | ORAL | Status: DC
  Administered 2022-10-20: 150 mg via ORAL
  Filled 2022-10-20: qty 1

## 2022-10-20 MED ORDER — DOXYCYCLINE HYCLATE 100 MG PO TABS
100.0000 mg | ORAL_TABLET | Freq: Once | ORAL | Status: AC
  Administered 2022-10-20: 100 mg via ORAL
  Filled 2022-10-20: qty 1

## 2022-10-20 MED ORDER — PREDNISONE 10 MG (21) PO TBPK: ORAL_TABLET | Freq: Every day | ORAL | 0 refills | Status: AC

## 2022-10-20 MED ORDER — ASPIRIN 81 MG PO CHEW
324.0000 mg | CHEWABLE_TABLET | Freq: Once | ORAL | Status: AC
  Administered 2022-10-20: 324 mg via ORAL
  Filled 2022-10-20: qty 4

## 2022-10-20 MED ORDER — CARVEDILOL 12.5 MG PO TABS
25.0000 mg | ORAL_TABLET | Freq: Once | ORAL | Status: AC
  Administered 2022-10-20: 25 mg via ORAL
  Filled 2022-10-20: qty 2

## 2022-10-20 MED ORDER — IPRATROPIUM-ALBUTEROL 0.5-2.5 (3) MG/3ML IN SOLN
3.0000 mL | Freq: Once | RESPIRATORY_TRACT | Status: AC
  Administered 2022-10-20: 3 mL via RESPIRATORY_TRACT
  Filled 2022-10-20: qty 3

## 2022-10-20 MED ORDER — AMOXICILLIN-POT CLAVULANATE 875-125 MG PO TABS: 1.0000 | ORAL_TABLET | Freq: Two times a day (BID) | ORAL | 0 refills | Status: AC

## 2022-10-20 MED ORDER — PREDNISONE 50 MG PO TABS
60.0000 mg | ORAL_TABLET | Freq: Once | ORAL | Status: AC
  Administered 2022-10-20: 60 mg via ORAL
  Filled 2022-10-20: qty 1

## 2022-10-20 MED ORDER — ALBUTEROL SULFATE HFA 108 (90 BASE) MCG/ACT IN AERS
1.0000 | INHALATION_SPRAY | RESPIRATORY_TRACT | Status: AC
  Administered 2022-10-20: 1 via RESPIRATORY_TRACT
  Filled 2022-10-20: qty 6.7

## 2022-10-20 NOTE — ED Provider Notes (Signed)
[facility] Provider Note   CSN: [id number] Arrival date & time: 10/20/22  1155     History  Chief Complaint  Patient presents with   Chest Pain    Susan Ross is a 89 y.o. female.   Chest Pain Associated symptoms: fatigue, headache, shortness of breath and weakness (Generalized)   Patient presents for multiple complaints.  Medical history includes HTN, COPD, CHF, HLD, CKD, arthritis.  She has undergone recent medication changes.  She has had labile blood pressures over the past week.  Over the past 3 days, patient's family has noted intermittent confusion.  Last night, she endorsed a chest heaviness and headache.  She was seen at PCPs office today for these new symptoms.  PCP sent her to the ED for further evaluation.  Patient endorses ongoing chest heaviness at this time.  She has not taken her morning medications today. She wears 2 to 3 L of supplemental oxygen at baseline.     Home Medications Prior to Admission medications   Medication Sig Start Date End Date Taking? Authorizing Provider  albuterol (VENTOLIN HFA) 108 (90 Base) MCG/ACT inhaler SMARTSIG:1 Puff(s) By Mouth Every 4 Hours PRN 04/26/21  Yes [provider]  amoxicillin-clavulanate (AUGMENTIN) 875-125 MG tablet Take 1 tablet by mouth every 12 (twelve) hours for 14 days. 10/20/22 11/03/22 Yes Godfrey Pick, MD  aspirin 81 MG chewable tablet Chew 81 mg by mouth daily. 09/25/21  Yes [provider]  atorvastatin (LIPITOR) 80 MG tablet Take 80 mg by mouth daily. 05/13/21 10/20/22 Yes [provider]  carvedilol (COREG) 25 MG tablet Take 25 mg by mouth 2 (two) times daily with a meal.   Yes [provider]  cetirizine (ZYRTEC) 10 MG tablet Take 10 mg by mouth daily. 10/01/21  Yes [provider]  doxycycline (VIBRAMYCIN) 100 MG capsule Take 1 capsule (100 mg total) by mouth 2 (two) times daily for 14 days. 10/20/22 11/03/22 Yes Godfrey Pick, MD   fluticasone (FLONASE) 50 MCG/ACT nasal spray Place 1 spray into both nostrils daily. 10/21/21  Yes Freddi Starr, MD  furosemide (LASIX) 20 MG tablet Take 20 mg by mouth daily as needed. 08/22/21  Yes [provider]  gabapentin (NEURONTIN) 300 MG capsule Take 300 mg by mouth at bedtime. 08/28/21  Yes [provider]  hydrALAZINE (APRESOLINE) 50 MG tablet Take 50 mg by mouth 3 (three) times daily. 08/28/21  Yes [provider]  isosorbide dinitrate (ISORDIL) 10 MG tablet Take 10 mg by mouth 3 (three) times daily. 09/25/21  Yes [provider]  olmesartan (BENICAR) 40 MG tablet Take 40 mg by mouth daily. 09/23/22  Yes [provider]  predniSONE (STERAPRED UNI-PAK 21 TAB) 10 MG (21) TBPK tablet Take by mouth daily. Take 6 tabs by mouth daily  for 2 days, then 5 tabs for 2 days, then 4 tabs for 2 days, then 3 tabs for 2 days, 2 tabs for 2 days, then 1 tab by mouth daily for 2 days 10/20/22  Yes Godfrey Pick, MD  TRELEGY ELLIPTA 100-62.5-25 MCG/ACT AEPB Take 1 puff by mouth daily. 10/01/21  Yes [provider]  ipratropium (ATROVENT) 0.03 % nasal spray Place 2 sprays into both nostrils every 12 (twelve) hours. Patient not taking: Reported on 10/20/2022 10/21/21   Freddi Starr, MD      Allergies    Patient has no known allergies.    Review of Systems   Review of  Systems  Constitutional:  Positive for fatigue.  Respiratory:  Positive for chest tightness and shortness of breath.   Cardiovascular:  Positive for chest pain.  Neurological:  Positive for weakness (Generalized) and headaches.  All other systems reviewed and are negative.   Physical Exam Updated Vital Signs BP (!) 175/89   Pulse 62   Temp 98.5 F (36.9 C) (Temporal)   Resp 13   Wt 70.3 kg   SpO2 100%   BMI 27.46 kg/m  Physical Exam Vitals and nursing note reviewed.  Constitutional:      General: She is not in acute distress.    Appearance: She is well-developed. She is  not ill-appearing, toxic-appearing or diaphoretic.  HENT:     Head: Normocephalic and atraumatic.  Eyes:     Conjunctiva/sclera: Conjunctivae normal.  Cardiovascular:     Rate and Rhythm: Normal rate and regular rhythm.     Heart sounds: No murmur heard. Pulmonary:     Effort: Pulmonary effort is normal. No tachypnea or respiratory distress.     Breath sounds: Decreased breath sounds present. No wheezing, rhonchi or rales.  Chest:     Chest wall: No tenderness.  Abdominal:     Palpations: Abdomen is soft.     Tenderness: There is no abdominal tenderness.  Musculoskeletal:        General: No swelling.     Cervical back: Normal range of motion and neck supple.     Right lower leg: No edema.     Left lower leg: No edema.  Skin:    General: Skin is warm and dry.     Coloration: Skin is not cyanotic or pale.  Neurological:     General: No focal deficit present.     Mental Status: She is alert and oriented to person, place, and time.  Psychiatric:        Mood and Affect: Mood normal.        Behavior: Behavior normal.     ED Results / Procedures / Treatments   Labs (all labs ordered are listed, but only abnormal results are displayed) Labs Reviewed  COMPREHENSIVE METABOLIC PANEL - Abnormal; Notable for the following components:      Result Value   Potassium 5.2 (*)    Glucose, Bld 115 (*)    BUN 40 (*)    Creatinine, Ser 1.67 (*)    AST 43 (*)    GFR, Estimated 29 (*)    All other components within normal limits  LIPASE, BLOOD - Abnormal; Notable for the following components:   Lipase 65 (*)    All other components within normal limits  BRAIN NATRIURETIC PEPTIDE - Abnormal; Notable for the following components:   B Natriuretic Peptide 892.0 (*)    All other components within normal limits  CBC WITH DIFFERENTIAL/PLATELET - Abnormal; Notable for the following components:   RBC 3.35 (*)    Hemoglobin 10.1 (*)    HCT 32.4 (*)    All other components within normal limits   RESP PANEL BY RT-PCR (RSV, FLU A&B, COVID)  RVPGX2  MAGNESIUM  PROTIME-INR  URINALYSIS, ROUTINE W REFLEX MICROSCOPIC  TROPONIN I (HIGH SENSITIVITY)  TROPONIN I (HIGH SENSITIVITY)    EKG EKG Interpretation  Date/Time:  Monday October 20 2022 12:17:42 EDT Ventricular Rate:  73 PR Interval:  180 QRS Duration: 95 QT Interval:  382 QTC Calculation: 421 R Axis:   57 Text Interpretation: Sinus rhythm Low voltage, extremity leads Confirmed by Godfrey Pick [phone])  on 10/20/2022 1:26:19 PM  Radiology CT Head Wo Contrast  Result Date: 10/20/2022 CLINICAL DATA:  Provided history: Headache, new onset. Confusion. Chest pain. EXAM: CT HEAD WITHOUT CONTRAST TECHNIQUE: Contiguous axial images were obtained from the base of the skull through the vertex without intravenous contrast. RADIATION DOSE REDUCTION: This exam was performed according to the departmental dose-optimization program which includes automated exposure control, adjustment of the mA and/or kV according to patient size and/or use of iterative reconstruction technique. COMPARISON:  Report from head CT 08/15/2015 (images currently unavailable). FINDINGS: Brain: Mild cerebral atrophy. Patchy and ill-defined hypoattenuation within the cerebral white matter, nonspecific but compatible with mild chronic small vessel ischemic disease. Symmetric mineralization within the bilateral basal ganglia. There is no acute intracranial hemorrhage. No demarcated cortical infarct. No extra-axial fluid collection. No evidence of an intracranial mass. No midline shift. Vascular: No hyperdense vessel. Atherosclerotic calcifications. Skull: No fracture or aggressive osseous lesion. Sinuses/Orbits: No mass or acute finding within the imaged orbits. Near complete opacification of the left frontal sinus. Complete opacification of anterior left ethmoid air cells. Complete opacification of the left maxillary sinus at the imaged levels (with associated chronic reactive osteitis).  There is contiguous opacity extending from the left maxillary sinus into the left nasal passage. Superimposed irregular foci of hyperdensity/mineralization within the left maxillary sinus, which can be seen in the setting of chronic fungal sinusitis. IMPRESSION: 1.  No evidence of an acute intracranial abnormality. 2. Mild chronic small vessel ischemic changes cerebral white matter. 3. Mild generalized cerebral atrophy. 4. Paranasal sinus disease at the imaged levels, as described. Most notably, there is severe left frontal, anterior ethmoid and maxillary sinus disease (left ostiomeatal unit obstructive pattern). Also of note, there is contiguous opacity extending from the left maxillary sinus into the left nasal passage. ENT consultation and direct visualization are recommended. Electronically Signed   By: Kellie Simmering D.O.   On: 10/20/2022 14:10   DG Chest Portable 1 View  Result Date: 10/20/2022 CLINICAL DATA:  Left-sided chest pain 2 days with shortness of breath. EXAM: PORTABLE CHEST 1 VIEW COMPARISON:  10/21/2021 FINDINGS: Lungs are hypoinflated with subtle chronic interstitial changes over the lung bases. No focal airspace consolidation or effusion. Cardiomediastinal silhouette and remainder of the exam is unchanged. IMPRESSION: Hypoinflation without acute cardiopulmonary disease. Electronically Signed   By: Marin Olp M.D.   On: 10/20/2022 13:09    Procedures Procedures    Medications Ordered in ED Medications  doxycycline (VIBRA-TABS) tablet 100 mg (has no administration in time range)  carvedilol (COREG) tablet 25 mg (has no administration in time range)  irbesartan (AVAPRO) tablet 150 mg (has no administration in time range)  aspirin chewable tablet 324 mg (324 mg Oral Given 10/20/22 1355)  sodium chloride 0.9 % bolus 250 mL (250 mLs Intravenous New Bag/Given 10/20/22 1528)  sodium zirconium cyclosilicate (LOKELMA) packet 10 g (10 g Oral Given 10/20/22 1528)  predniSONE (DELTASONE)  tablet 60 mg (60 mg Oral Given 10/20/22 1527)  amoxicillin-clavulanate (AUGMENTIN) 875-125 MG per tablet 1 tablet (1 tablet Oral Given 10/20/22 1528)    ED Course/ Medical Decision Making/ A&P                             Medical Decision Making Amount and/or Complexity of Data Reviewed Labs: ordered. Radiology: ordered.  Risk OTC drugs. Prescription drug management.   This patient presents to the ED for concern of multiple complaints, this involves an  extensive number of treatment options, and is a complaint that carries with it a high risk of complications and morbidity.  The differential diagnosis includes COPD exacerbation, pneumonia, deconditioning, dehydration, polypharmacy, ACS, other infection, other metabolic derangements   Co morbidities that complicate the patient evaluation  HTN, COPD, CHF, HLD, CKD, arthritis   Additional history obtained:  Additional history obtained from patient's daughters External records from outside source obtained and reviewed including EMR   Lab Tests:  I Ordered, and personally interpreted labs.  The pertinent results include: Mild hyperkalemia is present.  Creatinine is close to baseline.  Hemoglobin is consistent with prior lab work.  No leukocytosis is present.  Initial troponin is normal.  Lipase is slightly elevated but not consistent with pancreatitis.  BNP is elevated but decreased from December.   Imaging Studies ordered:  I ordered imaging studies including chest x-ray, CT head I independently visualized and interpreted imaging which showed no acute findings on x-ray.  CT head shows paranasal sinus disease with severe left frontal, anterior ethmoid, and maxillary sinus disease and contiguous opacity extending from left maxillary sinus in the left nasal passage. I agree with the radiologist interpretation   Cardiac Monitoring: / EKG:  The patient was maintained on a cardiac monitor.  I personally viewed and interpreted the  cardiac monitored which showed an underlying rhythm of: Sinus rhythm   Consultations Obtained:  I requested consultation with the otolaryngologist, Dr. Fredric Dine and discussed lab and imaging findings as well as pertinent plan - they recommend: Initiation of treatment of sinusitis with 2 weeks of antibiotics, Medrol Dosepak, daily saline rinses/lavage, and Flonase.  ENT will want to see her again in 3 weeks for repeat CT scan and direct visualization.  ENT office to reach out to facilitate follow-up.   Problem List / ED Course / Critical interventions / Medication management  Patient presents for multiple complaints.  Among these are headache and chest heaviness since yesterday.  Daughter reports that she has undergone recent medication changes and has had labile blood pressures over the past week.  Family noticed intermittent confusion 1 week ago.  On arrival, patient endorses continued chest heaviness, shortness of breath.  She has diminished breath sounds on lung auscultation.  She reports that she has had a productive cough that has been ongoing for the past several weeks.  She describes phlegm as brown in color.  DuoNeb was ordered.  EKG shows no concerning ST segment changes.  ASA was ordered.  Diagnostic workup was initiated. Chest x-ray did not show acute findings.  Initial troponin was normal.  Lab work was notable for mild hypokalemia.  IV fluids and Lokelma were ordered.  On CT of head, there was extensive paranasal sinus disease.  I did discuss this with ENT who gave recommendations for initiation of treatment for sinusitis and stated that they would like to see her again in office for repeat CT scan and direct visualization in 3 weeks.  Initial treatment with antibiotics and steroids was initiated.  On reassessment, patient reports that she is currently pain-free.  Blood pressure is elevated in the range of 170/100.  Patient reports that she has had very labile blood pressures at home on her  multiple antihypertensive medications.  Plan for now will be to reintroduce blood pressure medicines slowly.  Home dose of Coreg was ordered.  Half dose of her ARB was ordered as well.  She does continue to feel short of breath.  CT scan of chest was ordered.  At time of signout, repeat troponin, urinalysis, and CT scan were pending.  Care of patient was signed out to oncoming ED provider. I ordered medication including ASA for chest pressure; IV fluids, Lokelma, DuoNeb for hypokalemia; prednisone, Augmentin, and doxycycline for COPD as well as sinusitis; Coreg and Avapro for hypertension Reevaluation of the patient after these medicines showed that the patient improved I have reviewed the patients home medicines and have made adjustments as needed   Social Determinants of Health:  Lives at home with family        Final Clinical Impression(s) / ED Diagnoses Final diagnoses:  COPD exacerbation ([place])  Pansinusitis, unspecified chronicity    Rx / DC Orders ED Discharge Orders          Ordered    amoxicillin-clavulanate (AUGMENTIN) 875-125 MG tablet  Every 12 hours        10/20/22 1557    doxycycline (VIBRAMYCIN) 100 MG capsule  2 times daily        10/20/22 1557    predniSONE (STERAPRED UNI-PAK 21 TAB) 10 MG (21) TBPK tablet  Daily        10/20/22 1557              Godfrey Pick, MD 10/20/22 1557

## 2022-10-20 NOTE — ED Triage Notes (Signed)
Pt with left side CP since Saturday with SOB, pt is on home O2 2-3L/M.  100 % on 2.5 in triage. Pt with low BP last week, seen PCP today and sent here.

## 2022-10-20 NOTE — Discharge Instructions (Addendum)
The ear, nose, and throat doctor wants you to be on antibiotics for the next 2 weeks.  Prescriptions for these were prescribed.  There is also a tapering steroid prescription.  All of these medications will also help your COPD.  Continue breathing treatments at home as needed.  You should hear from the ENT office to schedule a follow-up appointment.  If you do not hear from them in the next couple days, call the number below to set up a follow-up appointment in 3 weeks.  Spread out your blood pressure medicines and continue to check your blood pressures at home to avoid episodes of dangerously low blood pressures.  Discuss changes to these blood pressure medicines with your primary care doctor.  Return to the emergency department for any new or worsening symptoms of concern.

## 2022-10-20 NOTE — ED Provider Notes (Signed)
  Physical Exam  BP (!) 192/130   Pulse (!) 59   Temp 98.1 F (36.7 C) (Oral)   Resp 16   Wt 70.3 kg   SpO2 98%   BMI 27.46 kg/m   Physical Exam  Procedures  Procedures  ED Course / MDM   Clinical Course as of 10/21/22 0100  Mon Oct 20, 2022  1701 Sent from Dr. Durwin Nora.  89 year old female with a history of COPD on 2 L nasal cannula, CHF, HTN, and HLD who presented to the emergency department with chest heaviness and headache.  CT head without acute abnormalities aside from sinusitis and will get antibiotics to treat for sinusitis.  EKG without ischemic changes and serial troponins WNL.  Thought to be due to COPD exacerbation as well as sinusitis.  Was given nebs, prednisone, and Augmentin here in the emergency department. [RP]  Tue Oct 21, 2022  0059 Late entry.  Patient was feeling much better after the above interventions.  Discussed hospitalization versus discharge with the patient she felt comfortable going home and pursuing outpatient treatment of her sinusitis and COPD exacerbation.  Was given an MDI to go home with as well as a prescription of antibiotics and prednisone.  Return precautions discussed prior to discharge.  Instructed to follow-up with her primary doctor in 2 to 3 days. [RP]    Clinical Course User Index [RP] Rondel Baton, MD   Medical Decision Making Amount and/or Complexity of Data Reviewed Labs: ordered. Radiology: ordered.  Risk OTC drugs. Prescription drug management.      Rondel Baton, MD 10/21/22 0100

## 2022-10-21 DIAGNOSIS — I5020 Unspecified systolic (congestive) heart failure: Secondary | ICD-10-CM | POA: Diagnosis not present

## 2022-10-21 DIAGNOSIS — J9622 Acute and chronic respiratory failure with hypercapnia: Secondary | ICD-10-CM | POA: Diagnosis not present

## 2022-10-21 DIAGNOSIS — R6520 Severe sepsis without septic shock: Secondary | ICD-10-CM | POA: Diagnosis not present

## 2022-10-21 DIAGNOSIS — N179 Acute kidney failure, unspecified: Secondary | ICD-10-CM | POA: Diagnosis not present

## 2022-10-21 DIAGNOSIS — R7989 Other specified abnormal findings of blood chemistry: Secondary | ICD-10-CM | POA: Diagnosis not present

## 2022-10-21 DIAGNOSIS — I129 Hypertensive chronic kidney disease with stage 1 through stage 4 chronic kidney disease, or unspecified chronic kidney disease: Secondary | ICD-10-CM | POA: Diagnosis not present

## 2022-10-21 DIAGNOSIS — J449 Chronic obstructive pulmonary disease, unspecified: Secondary | ICD-10-CM | POA: Diagnosis not present

## 2022-10-21 DIAGNOSIS — I160 Hypertensive urgency: Secondary | ICD-10-CM | POA: Diagnosis not present

## 2022-10-21 DIAGNOSIS — J440 Chronic obstructive pulmonary disease with acute lower respiratory infection: Secondary | ICD-10-CM | POA: Diagnosis not present

## 2022-10-21 DIAGNOSIS — Z87891 Personal history of nicotine dependence: Secondary | ICD-10-CM | POA: Diagnosis not present

## 2022-10-21 DIAGNOSIS — I214 Non-ST elevation (NSTEMI) myocardial infarction: Secondary | ICD-10-CM | POA: Diagnosis not present

## 2022-10-21 DIAGNOSIS — Z79899 Other long term (current) drug therapy: Secondary | ICD-10-CM | POA: Diagnosis not present

## 2022-10-21 DIAGNOSIS — R5381 Other malaise: Secondary | ICD-10-CM | POA: Diagnosis not present

## 2022-10-21 DIAGNOSIS — I21A1 Myocardial infarction type 2: Secondary | ICD-10-CM | POA: Diagnosis not present

## 2022-10-21 DIAGNOSIS — Z794 Long term (current) use of insulin: Secondary | ICD-10-CM | POA: Diagnosis not present

## 2022-10-21 DIAGNOSIS — A419 Sepsis, unspecified organism: Secondary | ICD-10-CM | POA: Diagnosis not present

## 2022-10-21 DIAGNOSIS — E785 Hyperlipidemia, unspecified: Secondary | ICD-10-CM | POA: Diagnosis not present

## 2022-10-21 DIAGNOSIS — N189 Chronic kidney disease, unspecified: Secondary | ICD-10-CM | POA: Diagnosis not present

## 2022-10-21 DIAGNOSIS — J9621 Acute and chronic respiratory failure with hypoxia: Secondary | ICD-10-CM | POA: Diagnosis not present

## 2022-10-21 DIAGNOSIS — I2510 Atherosclerotic heart disease of native coronary artery without angina pectoris: Secondary | ICD-10-CM | POA: Diagnosis not present

## 2022-10-21 DIAGNOSIS — Z7982 Long term (current) use of aspirin: Secondary | ICD-10-CM | POA: Diagnosis not present

## 2022-10-21 DIAGNOSIS — R062 Wheezing: Secondary | ICD-10-CM | POA: Diagnosis not present

## 2022-10-21 DIAGNOSIS — I5031 Acute diastolic (congestive) heart failure: Secondary | ICD-10-CM | POA: Diagnosis not present

## 2022-10-21 DIAGNOSIS — Z792 Long term (current) use of antibiotics: Secondary | ICD-10-CM | POA: Diagnosis not present

## 2022-10-21 DIAGNOSIS — N183 Chronic kidney disease, stage 3 unspecified: Secondary | ICD-10-CM | POA: Diagnosis not present

## 2022-10-21 DIAGNOSIS — I509 Heart failure, unspecified: Secondary | ICD-10-CM | POA: Diagnosis not present

## 2022-10-21 DIAGNOSIS — Z20822 Contact with and (suspected) exposure to covid-19: Secondary | ICD-10-CM | POA: Diagnosis not present

## 2022-10-21 DIAGNOSIS — E1122 Type 2 diabetes mellitus with diabetic chronic kidney disease: Secondary | ICD-10-CM | POA: Diagnosis not present

## 2022-10-21 DIAGNOSIS — I130 Hypertensive heart and chronic kidney disease with heart failure and stage 1 through stage 4 chronic kidney disease, or unspecified chronic kidney disease: Secondary | ICD-10-CM | POA: Diagnosis not present

## 2022-10-21 DIAGNOSIS — R54 Age-related physical debility: Secondary | ICD-10-CM | POA: Diagnosis not present

## 2022-10-21 DIAGNOSIS — Z743 Need for continuous supervision: Secondary | ICD-10-CM | POA: Diagnosis not present

## 2022-10-21 DIAGNOSIS — N184 Chronic kidney disease, stage 4 (severe): Secondary | ICD-10-CM | POA: Diagnosis not present

## 2022-10-21 DIAGNOSIS — J441 Chronic obstructive pulmonary disease with (acute) exacerbation: Secondary | ICD-10-CM | POA: Diagnosis not present

## 2022-10-21 DIAGNOSIS — Z66 Do not resuscitate: Secondary | ICD-10-CM | POA: Diagnosis not present

## 2022-10-21 DIAGNOSIS — J439 Emphysema, unspecified: Secondary | ICD-10-CM | POA: Diagnosis not present

## 2022-10-21 DIAGNOSIS — J9600 Acute respiratory failure, unspecified whether with hypoxia or hypercapnia: Secondary | ICD-10-CM | POA: Diagnosis not present

## 2022-10-21 DIAGNOSIS — R0600 Dyspnea, unspecified: Secondary | ICD-10-CM | POA: Diagnosis not present

## 2022-10-21 DIAGNOSIS — R6889 Other general symptoms and signs: Secondary | ICD-10-CM | POA: Diagnosis not present

## 2022-10-21 DIAGNOSIS — Z9989 Dependence on other enabling machines and devices: Secondary | ICD-10-CM | POA: Diagnosis not present

## 2022-10-22 DIAGNOSIS — N179 Acute kidney failure, unspecified: Secondary | ICD-10-CM | POA: Diagnosis not present

## 2022-10-22 DIAGNOSIS — Z9989 Dependence on other enabling machines and devices: Secondary | ICD-10-CM | POA: Diagnosis not present

## 2022-10-22 DIAGNOSIS — I21A1 Myocardial infarction type 2: Secondary | ICD-10-CM | POA: Diagnosis not present

## 2022-10-22 DIAGNOSIS — N184 Chronic kidney disease, stage 4 (severe): Secondary | ICD-10-CM | POA: Diagnosis not present

## 2022-10-22 DIAGNOSIS — R7989 Other specified abnormal findings of blood chemistry: Secondary | ICD-10-CM | POA: Diagnosis not present

## 2022-10-22 DIAGNOSIS — R5381 Other malaise: Secondary | ICD-10-CM | POA: Diagnosis not present

## 2022-10-22 DIAGNOSIS — I5031 Acute diastolic (congestive) heart failure: Secondary | ICD-10-CM | POA: Diagnosis not present

## 2022-10-22 DIAGNOSIS — I214 Non-ST elevation (NSTEMI) myocardial infarction: Secondary | ICD-10-CM | POA: Diagnosis not present

## 2022-10-22 DIAGNOSIS — J9621 Acute and chronic respiratory failure with hypoxia: Secondary | ICD-10-CM | POA: Diagnosis not present

## 2022-10-22 DIAGNOSIS — N183 Chronic kidney disease, stage 3 unspecified: Secondary | ICD-10-CM | POA: Diagnosis not present

## 2022-10-22 DIAGNOSIS — I129 Hypertensive chronic kidney disease with stage 1 through stage 4 chronic kidney disease, or unspecified chronic kidney disease: Secondary | ICD-10-CM | POA: Diagnosis not present

## 2022-10-22 DIAGNOSIS — Z79899 Other long term (current) drug therapy: Secondary | ICD-10-CM | POA: Diagnosis not present

## 2022-10-22 DIAGNOSIS — J441 Chronic obstructive pulmonary disease with (acute) exacerbation: Secondary | ICD-10-CM | POA: Diagnosis not present

## 2022-10-22 DIAGNOSIS — J9622 Acute and chronic respiratory failure with hypercapnia: Secondary | ICD-10-CM | POA: Diagnosis not present

## 2022-10-22 DIAGNOSIS — I160 Hypertensive urgency: Secondary | ICD-10-CM | POA: Diagnosis not present

## 2022-10-23 DIAGNOSIS — I130 Hypertensive heart and chronic kidney disease with heart failure and stage 1 through stage 4 chronic kidney disease, or unspecified chronic kidney disease: Secondary | ICD-10-CM | POA: Diagnosis not present

## 2022-10-23 DIAGNOSIS — J441 Chronic obstructive pulmonary disease with (acute) exacerbation: Secondary | ICD-10-CM | POA: Diagnosis not present

## 2022-10-23 DIAGNOSIS — I5020 Unspecified systolic (congestive) heart failure: Secondary | ICD-10-CM | POA: Diagnosis not present

## 2022-10-23 DIAGNOSIS — N183 Chronic kidney disease, stage 3 unspecified: Secondary | ICD-10-CM | POA: Diagnosis not present

## 2022-10-23 DIAGNOSIS — R54 Age-related physical debility: Secondary | ICD-10-CM | POA: Diagnosis not present

## 2022-10-23 DIAGNOSIS — N184 Chronic kidney disease, stage 4 (severe): Secondary | ICD-10-CM | POA: Diagnosis not present

## 2022-10-23 DIAGNOSIS — J9621 Acute and chronic respiratory failure with hypoxia: Secondary | ICD-10-CM | POA: Diagnosis not present

## 2022-10-23 DIAGNOSIS — I160 Hypertensive urgency: Secondary | ICD-10-CM | POA: Diagnosis not present

## 2022-10-23 DIAGNOSIS — J9622 Acute and chronic respiratory failure with hypercapnia: Secondary | ICD-10-CM | POA: Diagnosis not present

## 2022-10-23 DIAGNOSIS — N179 Acute kidney failure, unspecified: Secondary | ICD-10-CM | POA: Diagnosis not present

## 2022-10-23 DIAGNOSIS — I5031 Acute diastolic (congestive) heart failure: Secondary | ICD-10-CM | POA: Diagnosis not present

## 2022-10-24 DIAGNOSIS — N184 Chronic kidney disease, stage 4 (severe): Secondary | ICD-10-CM | POA: Diagnosis not present

## 2022-10-24 DIAGNOSIS — I130 Hypertensive heart and chronic kidney disease with heart failure and stage 1 through stage 4 chronic kidney disease, or unspecified chronic kidney disease: Secondary | ICD-10-CM | POA: Diagnosis not present

## 2022-10-24 DIAGNOSIS — N179 Acute kidney failure, unspecified: Secondary | ICD-10-CM | POA: Diagnosis not present

## 2022-10-24 DIAGNOSIS — N189 Chronic kidney disease, unspecified: Secondary | ICD-10-CM | POA: Diagnosis not present

## 2022-10-24 DIAGNOSIS — I160 Hypertensive urgency: Secondary | ICD-10-CM | POA: Diagnosis not present

## 2022-10-24 DIAGNOSIS — I21A1 Myocardial infarction type 2: Secondary | ICD-10-CM | POA: Diagnosis not present

## 2022-10-24 DIAGNOSIS — I2510 Atherosclerotic heart disease of native coronary artery without angina pectoris: Secondary | ICD-10-CM | POA: Diagnosis not present

## 2022-10-24 DIAGNOSIS — I5031 Acute diastolic (congestive) heart failure: Secondary | ICD-10-CM | POA: Diagnosis not present

## 2022-10-24 DIAGNOSIS — J9622 Acute and chronic respiratory failure with hypercapnia: Secondary | ICD-10-CM | POA: Diagnosis not present

## 2022-10-24 DIAGNOSIS — I5020 Unspecified systolic (congestive) heart failure: Secondary | ICD-10-CM | POA: Diagnosis not present

## 2022-10-24 DIAGNOSIS — J9621 Acute and chronic respiratory failure with hypoxia: Secondary | ICD-10-CM | POA: Diagnosis not present

## 2022-10-24 DIAGNOSIS — J449 Chronic obstructive pulmonary disease, unspecified: Secondary | ICD-10-CM | POA: Diagnosis not present

## 2022-10-25 DIAGNOSIS — I5181 Takotsubo syndrome: Secondary | ICD-10-CM | POA: Diagnosis not present

## 2022-10-28 ENCOUNTER — Ambulatory Visit: Payer: PPO | Admitting: Gastroenterology

## 2022-11-02 DIAGNOSIS — I130 Hypertensive heart and chronic kidney disease with heart failure and stage 1 through stage 4 chronic kidney disease, or unspecified chronic kidney disease: Secondary | ICD-10-CM | POA: Diagnosis not present

## 2022-11-02 DIAGNOSIS — I5032 Chronic diastolic (congestive) heart failure: Secondary | ICD-10-CM | POA: Diagnosis not present

## 2022-11-02 DIAGNOSIS — N184 Chronic kidney disease, stage 4 (severe): Secondary | ICD-10-CM | POA: Diagnosis not present

## 2022-11-02 DIAGNOSIS — Z87891 Personal history of nicotine dependence: Secondary | ICD-10-CM | POA: Diagnosis not present

## 2022-11-02 DIAGNOSIS — Z79899 Other long term (current) drug therapy: Secondary | ICD-10-CM | POA: Diagnosis not present

## 2022-11-02 DIAGNOSIS — R404 Transient alteration of awareness: Secondary | ICD-10-CM | POA: Diagnosis not present

## 2022-11-02 DIAGNOSIS — R55 Syncope and collapse: Secondary | ICD-10-CM | POA: Diagnosis not present

## 2022-11-02 DIAGNOSIS — K449 Diaphragmatic hernia without obstruction or gangrene: Secondary | ICD-10-CM | POA: Diagnosis not present

## 2022-11-02 DIAGNOSIS — Z9981 Dependence on supplemental oxygen: Secondary | ICD-10-CM | POA: Diagnosis not present

## 2022-11-02 DIAGNOSIS — I959 Hypotension, unspecified: Secondary | ICD-10-CM | POA: Diagnosis not present

## 2022-11-02 DIAGNOSIS — J9622 Acute and chronic respiratory failure with hypercapnia: Secondary | ICD-10-CM | POA: Diagnosis not present

## 2022-11-02 DIAGNOSIS — T68XXXA Hypothermia, initial encounter: Secondary | ICD-10-CM | POA: Diagnosis not present

## 2022-11-02 DIAGNOSIS — J849 Interstitial pulmonary disease, unspecified: Secondary | ICD-10-CM | POA: Diagnosis not present

## 2022-11-02 DIAGNOSIS — A419 Sepsis, unspecified organism: Secondary | ICD-10-CM | POA: Diagnosis not present

## 2022-11-02 DIAGNOSIS — J9611 Chronic respiratory failure with hypoxia: Secondary | ICD-10-CM | POA: Diagnosis not present

## 2022-11-02 DIAGNOSIS — R6521 Severe sepsis with septic shock: Secondary | ICD-10-CM | POA: Diagnosis not present

## 2022-11-02 DIAGNOSIS — I10 Essential (primary) hypertension: Secondary | ICD-10-CM | POA: Diagnosis not present

## 2022-11-02 DIAGNOSIS — J9621 Acute and chronic respiratory failure with hypoxia: Secondary | ICD-10-CM | POA: Diagnosis not present

## 2022-11-02 DIAGNOSIS — R680 Hypothermia, not associated with low environmental temperature: Secondary | ICD-10-CM | POA: Diagnosis not present

## 2022-11-02 DIAGNOSIS — G9341 Metabolic encephalopathy: Secondary | ICD-10-CM | POA: Diagnosis not present

## 2022-11-02 DIAGNOSIS — E1122 Type 2 diabetes mellitus with diabetic chronic kidney disease: Secondary | ICD-10-CM | POA: Diagnosis not present

## 2022-11-02 DIAGNOSIS — I5181 Takotsubo syndrome: Secondary | ICD-10-CM | POA: Diagnosis not present

## 2022-11-02 DIAGNOSIS — Z7951 Long term (current) use of inhaled steroids: Secondary | ICD-10-CM | POA: Diagnosis not present

## 2022-11-02 DIAGNOSIS — Z7982 Long term (current) use of aspirin: Secondary | ICD-10-CM | POA: Diagnosis not present

## 2022-11-02 DIAGNOSIS — J449 Chronic obstructive pulmonary disease, unspecified: Secondary | ICD-10-CM | POA: Diagnosis not present

## 2022-11-02 DIAGNOSIS — D631 Anemia in chronic kidney disease: Secondary | ICD-10-CM | POA: Diagnosis not present

## 2022-11-02 DIAGNOSIS — R54 Age-related physical debility: Secondary | ICD-10-CM | POA: Diagnosis not present

## 2022-11-02 DIAGNOSIS — I21A1 Myocardial infarction type 2: Secondary | ICD-10-CM | POA: Diagnosis not present

## 2022-11-02 DIAGNOSIS — J1569 Pneumonia due to other gram-negative bacteria: Secondary | ICD-10-CM | POA: Diagnosis not present

## 2022-11-02 DIAGNOSIS — R6889 Other general symptoms and signs: Secondary | ICD-10-CM | POA: Diagnosis not present

## 2022-11-02 DIAGNOSIS — I5020 Unspecified systolic (congestive) heart failure: Secondary | ICD-10-CM | POA: Diagnosis not present

## 2022-11-02 DIAGNOSIS — J440 Chronic obstructive pulmonary disease with acute lower respiratory infection: Secondary | ICD-10-CM | POA: Diagnosis not present

## 2022-11-02 DIAGNOSIS — I9589 Other hypotension: Secondary | ICD-10-CM | POA: Diagnosis not present

## 2022-11-02 DIAGNOSIS — I110 Hypertensive heart disease with heart failure: Secondary | ICD-10-CM | POA: Diagnosis not present

## 2022-11-02 DIAGNOSIS — Z743 Need for continuous supervision: Secondary | ICD-10-CM | POA: Diagnosis not present

## 2022-11-02 DIAGNOSIS — Z66 Do not resuscitate: Secondary | ICD-10-CM | POA: Diagnosis not present

## 2022-11-02 DIAGNOSIS — I2510 Atherosclerotic heart disease of native coronary artery without angina pectoris: Secondary | ICD-10-CM | POA: Diagnosis not present

## 2022-11-05 DIAGNOSIS — I21A1 Myocardial infarction type 2: Secondary | ICD-10-CM | POA: Diagnosis not present

## 2022-11-05 DIAGNOSIS — I5032 Chronic diastolic (congestive) heart failure: Secondary | ICD-10-CM | POA: Diagnosis not present

## 2022-11-05 DIAGNOSIS — N184 Chronic kidney disease, stage 4 (severe): Secondary | ICD-10-CM | POA: Diagnosis not present

## 2022-11-05 DIAGNOSIS — D631 Anemia in chronic kidney disease: Secondary | ICD-10-CM | POA: Diagnosis not present

## 2022-11-05 DIAGNOSIS — J9621 Acute and chronic respiratory failure with hypoxia: Secondary | ICD-10-CM | POA: Diagnosis not present

## 2022-11-05 DIAGNOSIS — E1122 Type 2 diabetes mellitus with diabetic chronic kidney disease: Secondary | ICD-10-CM | POA: Diagnosis not present

## 2022-11-05 DIAGNOSIS — J9622 Acute and chronic respiratory failure with hypercapnia: Secondary | ICD-10-CM | POA: Diagnosis not present

## 2022-11-05 DIAGNOSIS — I130 Hypertensive heart and chronic kidney disease with heart failure and stage 1 through stage 4 chronic kidney disease, or unspecified chronic kidney disease: Secondary | ICD-10-CM | POA: Diagnosis not present

## 2022-11-05 DIAGNOSIS — R54 Age-related physical debility: Secondary | ICD-10-CM | POA: Diagnosis not present

## 2022-11-05 DIAGNOSIS — I2510 Atherosclerotic heart disease of native coronary artery without angina pectoris: Secondary | ICD-10-CM | POA: Diagnosis not present

## 2022-11-05 DIAGNOSIS — J449 Chronic obstructive pulmonary disease, unspecified: Secondary | ICD-10-CM | POA: Diagnosis not present

## 2022-11-11 DIAGNOSIS — J440 Chronic obstructive pulmonary disease with acute lower respiratory infection: Secondary | ICD-10-CM | POA: Diagnosis not present

## 2022-11-11 DIAGNOSIS — Z7984 Long term (current) use of oral hypoglycemic drugs: Secondary | ICD-10-CM | POA: Diagnosis not present

## 2022-11-11 DIAGNOSIS — I252 Old myocardial infarction: Secondary | ICD-10-CM | POA: Diagnosis not present

## 2022-11-11 DIAGNOSIS — D631 Anemia in chronic kidney disease: Secondary | ICD-10-CM | POA: Diagnosis not present

## 2022-11-11 DIAGNOSIS — I130 Hypertensive heart and chronic kidney disease with heart failure and stage 1 through stage 4 chronic kidney disease, or unspecified chronic kidney disease: Secondary | ICD-10-CM | POA: Diagnosis not present

## 2022-11-11 DIAGNOSIS — J9622 Acute and chronic respiratory failure with hypercapnia: Secondary | ICD-10-CM | POA: Diagnosis not present

## 2022-11-11 DIAGNOSIS — J9621 Acute and chronic respiratory failure with hypoxia: Secondary | ICD-10-CM | POA: Diagnosis not present

## 2022-11-11 DIAGNOSIS — I5032 Chronic diastolic (congestive) heart failure: Secondary | ICD-10-CM | POA: Diagnosis not present

## 2022-11-11 DIAGNOSIS — I2510 Atherosclerotic heart disease of native coronary artery without angina pectoris: Secondary | ICD-10-CM | POA: Diagnosis not present

## 2022-11-11 DIAGNOSIS — L89611 Pressure ulcer of right heel, stage 1: Secondary | ICD-10-CM | POA: Diagnosis not present

## 2022-11-11 DIAGNOSIS — L89621 Pressure ulcer of left heel, stage 1: Secondary | ICD-10-CM | POA: Diagnosis not present

## 2022-11-11 DIAGNOSIS — H9193 Unspecified hearing loss, bilateral: Secondary | ICD-10-CM | POA: Diagnosis not present

## 2022-11-11 DIAGNOSIS — Z9981 Dependence on supplemental oxygen: Secondary | ICD-10-CM | POA: Diagnosis not present

## 2022-11-11 DIAGNOSIS — N184 Chronic kidney disease, stage 4 (severe): Secondary | ICD-10-CM | POA: Diagnosis not present

## 2022-11-11 DIAGNOSIS — Z7982 Long term (current) use of aspirin: Secondary | ICD-10-CM | POA: Diagnosis not present

## 2022-11-12 DIAGNOSIS — I5032 Chronic diastolic (congestive) heart failure: Secondary | ICD-10-CM | POA: Diagnosis not present

## 2022-11-12 DIAGNOSIS — I252 Old myocardial infarction: Secondary | ICD-10-CM | POA: Diagnosis not present

## 2022-11-12 DIAGNOSIS — H9193 Unspecified hearing loss, bilateral: Secondary | ICD-10-CM | POA: Diagnosis not present

## 2022-11-12 DIAGNOSIS — L89611 Pressure ulcer of right heel, stage 1: Secondary | ICD-10-CM | POA: Diagnosis not present

## 2022-11-12 DIAGNOSIS — I2510 Atherosclerotic heart disease of native coronary artery without angina pectoris: Secondary | ICD-10-CM | POA: Diagnosis not present

## 2022-11-12 DIAGNOSIS — N184 Chronic kidney disease, stage 4 (severe): Secondary | ICD-10-CM | POA: Diagnosis not present

## 2022-11-12 DIAGNOSIS — L89621 Pressure ulcer of left heel, stage 1: Secondary | ICD-10-CM | POA: Diagnosis not present

## 2022-11-12 DIAGNOSIS — J9621 Acute and chronic respiratory failure with hypoxia: Secondary | ICD-10-CM | POA: Diagnosis not present

## 2022-11-12 DIAGNOSIS — J440 Chronic obstructive pulmonary disease with acute lower respiratory infection: Secondary | ICD-10-CM | POA: Diagnosis not present

## 2022-11-12 DIAGNOSIS — J449 Chronic obstructive pulmonary disease, unspecified: Secondary | ICD-10-CM | POA: Diagnosis not present

## 2022-11-12 DIAGNOSIS — D631 Anemia in chronic kidney disease: Secondary | ICD-10-CM | POA: Diagnosis not present

## 2022-11-12 DIAGNOSIS — I130 Hypertensive heart and chronic kidney disease with heart failure and stage 1 through stage 4 chronic kidney disease, or unspecified chronic kidney disease: Secondary | ICD-10-CM | POA: Diagnosis not present

## 2022-11-12 DIAGNOSIS — J9622 Acute and chronic respiratory failure with hypercapnia: Secondary | ICD-10-CM | POA: Diagnosis not present

## 2022-11-12 DIAGNOSIS — Z7982 Long term (current) use of aspirin: Secondary | ICD-10-CM | POA: Diagnosis not present

## 2022-11-12 DIAGNOSIS — Z7984 Long term (current) use of oral hypoglycemic drugs: Secondary | ICD-10-CM | POA: Diagnosis not present

## 2022-11-12 DIAGNOSIS — Z9981 Dependence on supplemental oxygen: Secondary | ICD-10-CM | POA: Diagnosis not present

## 2022-11-12 DIAGNOSIS — I5181 Takotsubo syndrome: Secondary | ICD-10-CM | POA: Diagnosis not present

## 2022-11-13 DIAGNOSIS — I10 Essential (primary) hypertension: Secondary | ICD-10-CM | POA: Diagnosis not present

## 2022-11-13 DIAGNOSIS — J9612 Chronic respiratory failure with hypercapnia: Secondary | ICD-10-CM | POA: Diagnosis not present

## 2022-11-19 DIAGNOSIS — Z7982 Long term (current) use of aspirin: Secondary | ICD-10-CM | POA: Diagnosis not present

## 2022-11-19 DIAGNOSIS — N184 Chronic kidney disease, stage 4 (severe): Secondary | ICD-10-CM | POA: Diagnosis not present

## 2022-11-19 DIAGNOSIS — Z7984 Long term (current) use of oral hypoglycemic drugs: Secondary | ICD-10-CM | POA: Diagnosis not present

## 2022-11-19 DIAGNOSIS — H9193 Unspecified hearing loss, bilateral: Secondary | ICD-10-CM | POA: Diagnosis not present

## 2022-11-19 DIAGNOSIS — J9622 Acute and chronic respiratory failure with hypercapnia: Secondary | ICD-10-CM | POA: Diagnosis not present

## 2022-11-19 DIAGNOSIS — J440 Chronic obstructive pulmonary disease with acute lower respiratory infection: Secondary | ICD-10-CM | POA: Diagnosis not present

## 2022-11-19 DIAGNOSIS — I2510 Atherosclerotic heart disease of native coronary artery without angina pectoris: Secondary | ICD-10-CM | POA: Diagnosis not present

## 2022-11-19 DIAGNOSIS — I130 Hypertensive heart and chronic kidney disease with heart failure and stage 1 through stage 4 chronic kidney disease, or unspecified chronic kidney disease: Secondary | ICD-10-CM | POA: Diagnosis not present

## 2022-11-19 DIAGNOSIS — L89621 Pressure ulcer of left heel, stage 1: Secondary | ICD-10-CM | POA: Diagnosis not present

## 2022-11-19 DIAGNOSIS — D631 Anemia in chronic kidney disease: Secondary | ICD-10-CM | POA: Diagnosis not present

## 2022-11-19 DIAGNOSIS — I5032 Chronic diastolic (congestive) heart failure: Secondary | ICD-10-CM | POA: Diagnosis not present

## 2022-11-19 DIAGNOSIS — Z9981 Dependence on supplemental oxygen: Secondary | ICD-10-CM | POA: Diagnosis not present

## 2022-11-19 DIAGNOSIS — J9621 Acute and chronic respiratory failure with hypoxia: Secondary | ICD-10-CM | POA: Diagnosis not present

## 2022-11-19 DIAGNOSIS — L89611 Pressure ulcer of right heel, stage 1: Secondary | ICD-10-CM | POA: Diagnosis not present

## 2022-11-19 DIAGNOSIS — I252 Old myocardial infarction: Secondary | ICD-10-CM | POA: Diagnosis not present

## 2022-11-24 DIAGNOSIS — Z9981 Dependence on supplemental oxygen: Secondary | ICD-10-CM | POA: Diagnosis not present

## 2022-11-24 DIAGNOSIS — L89611 Pressure ulcer of right heel, stage 1: Secondary | ICD-10-CM | POA: Diagnosis not present

## 2022-11-24 DIAGNOSIS — Z7982 Long term (current) use of aspirin: Secondary | ICD-10-CM | POA: Diagnosis not present

## 2022-11-24 DIAGNOSIS — H9193 Unspecified hearing loss, bilateral: Secondary | ICD-10-CM | POA: Diagnosis not present

## 2022-11-24 DIAGNOSIS — I5032 Chronic diastolic (congestive) heart failure: Secondary | ICD-10-CM | POA: Diagnosis not present

## 2022-11-24 DIAGNOSIS — J9622 Acute and chronic respiratory failure with hypercapnia: Secondary | ICD-10-CM | POA: Diagnosis not present

## 2022-11-24 DIAGNOSIS — J9621 Acute and chronic respiratory failure with hypoxia: Secondary | ICD-10-CM | POA: Diagnosis not present

## 2022-11-24 DIAGNOSIS — Z7984 Long term (current) use of oral hypoglycemic drugs: Secondary | ICD-10-CM | POA: Diagnosis not present

## 2022-11-24 DIAGNOSIS — L89621 Pressure ulcer of left heel, stage 1: Secondary | ICD-10-CM | POA: Diagnosis not present

## 2022-11-24 DIAGNOSIS — D631 Anemia in chronic kidney disease: Secondary | ICD-10-CM | POA: Diagnosis not present

## 2022-11-24 DIAGNOSIS — J440 Chronic obstructive pulmonary disease with acute lower respiratory infection: Secondary | ICD-10-CM | POA: Diagnosis not present

## 2022-11-24 DIAGNOSIS — N184 Chronic kidney disease, stage 4 (severe): Secondary | ICD-10-CM | POA: Diagnosis not present

## 2022-11-24 DIAGNOSIS — I2510 Atherosclerotic heart disease of native coronary artery without angina pectoris: Secondary | ICD-10-CM | POA: Diagnosis not present

## 2022-11-24 DIAGNOSIS — I252 Old myocardial infarction: Secondary | ICD-10-CM | POA: Diagnosis not present

## 2022-11-24 DIAGNOSIS — I130 Hypertensive heart and chronic kidney disease with heart failure and stage 1 through stage 4 chronic kidney disease, or unspecified chronic kidney disease: Secondary | ICD-10-CM | POA: Diagnosis not present

## 2022-11-25 DIAGNOSIS — I5181 Takotsubo syndrome: Secondary | ICD-10-CM | POA: Diagnosis not present

## 2022-11-26 DIAGNOSIS — J449 Chronic obstructive pulmonary disease, unspecified: Secondary | ICD-10-CM | POA: Diagnosis not present

## 2022-11-26 DIAGNOSIS — I5032 Chronic diastolic (congestive) heart failure: Secondary | ICD-10-CM | POA: Diagnosis not present

## 2022-11-26 DIAGNOSIS — J9610 Chronic respiratory failure, unspecified whether with hypoxia or hypercapnia: Secondary | ICD-10-CM | POA: Diagnosis not present

## 2022-11-26 DIAGNOSIS — Z515 Encounter for palliative care: Secondary | ICD-10-CM | POA: Diagnosis not present

## 2022-11-28 DIAGNOSIS — I5032 Chronic diastolic (congestive) heart failure: Secondary | ICD-10-CM | POA: Diagnosis not present

## 2022-11-28 DIAGNOSIS — J9622 Acute and chronic respiratory failure with hypercapnia: Secondary | ICD-10-CM | POA: Diagnosis not present

## 2022-11-28 DIAGNOSIS — H9193 Unspecified hearing loss, bilateral: Secondary | ICD-10-CM | POA: Diagnosis not present

## 2022-11-28 DIAGNOSIS — J9621 Acute and chronic respiratory failure with hypoxia: Secondary | ICD-10-CM | POA: Diagnosis not present

## 2022-11-28 DIAGNOSIS — Z9981 Dependence on supplemental oxygen: Secondary | ICD-10-CM | POA: Diagnosis not present

## 2022-11-28 DIAGNOSIS — D631 Anemia in chronic kidney disease: Secondary | ICD-10-CM | POA: Diagnosis not present

## 2022-11-28 DIAGNOSIS — J440 Chronic obstructive pulmonary disease with acute lower respiratory infection: Secondary | ICD-10-CM | POA: Diagnosis not present

## 2022-11-28 DIAGNOSIS — I2510 Atherosclerotic heart disease of native coronary artery without angina pectoris: Secondary | ICD-10-CM | POA: Diagnosis not present

## 2022-11-28 DIAGNOSIS — I130 Hypertensive heart and chronic kidney disease with heart failure and stage 1 through stage 4 chronic kidney disease, or unspecified chronic kidney disease: Secondary | ICD-10-CM | POA: Diagnosis not present

## 2022-11-28 DIAGNOSIS — L89611 Pressure ulcer of right heel, stage 1: Secondary | ICD-10-CM | POA: Diagnosis not present

## 2022-11-28 DIAGNOSIS — N184 Chronic kidney disease, stage 4 (severe): Secondary | ICD-10-CM | POA: Diagnosis not present

## 2022-11-28 DIAGNOSIS — Z7982 Long term (current) use of aspirin: Secondary | ICD-10-CM | POA: Diagnosis not present

## 2022-11-28 DIAGNOSIS — Z7984 Long term (current) use of oral hypoglycemic drugs: Secondary | ICD-10-CM | POA: Diagnosis not present

## 2022-11-28 DIAGNOSIS — L89621 Pressure ulcer of left heel, stage 1: Secondary | ICD-10-CM | POA: Diagnosis not present

## 2022-11-28 DIAGNOSIS — I252 Old myocardial infarction: Secondary | ICD-10-CM | POA: Diagnosis not present

## 2022-12-02 DIAGNOSIS — D631 Anemia in chronic kidney disease: Secondary | ICD-10-CM | POA: Diagnosis not present

## 2022-12-02 DIAGNOSIS — I130 Hypertensive heart and chronic kidney disease with heart failure and stage 1 through stage 4 chronic kidney disease, or unspecified chronic kidney disease: Secondary | ICD-10-CM | POA: Diagnosis not present

## 2022-12-02 DIAGNOSIS — Z7982 Long term (current) use of aspirin: Secondary | ICD-10-CM | POA: Diagnosis not present

## 2022-12-02 DIAGNOSIS — Z9981 Dependence on supplemental oxygen: Secondary | ICD-10-CM | POA: Diagnosis not present

## 2022-12-02 DIAGNOSIS — I5032 Chronic diastolic (congestive) heart failure: Secondary | ICD-10-CM | POA: Diagnosis not present

## 2022-12-02 DIAGNOSIS — N184 Chronic kidney disease, stage 4 (severe): Secondary | ICD-10-CM | POA: Diagnosis not present

## 2022-12-02 DIAGNOSIS — I2510 Atherosclerotic heart disease of native coronary artery without angina pectoris: Secondary | ICD-10-CM | POA: Diagnosis not present

## 2022-12-02 DIAGNOSIS — H9193 Unspecified hearing loss, bilateral: Secondary | ICD-10-CM | POA: Diagnosis not present

## 2022-12-02 DIAGNOSIS — L89621 Pressure ulcer of left heel, stage 1: Secondary | ICD-10-CM | POA: Diagnosis not present

## 2022-12-02 DIAGNOSIS — I252 Old myocardial infarction: Secondary | ICD-10-CM | POA: Diagnosis not present

## 2022-12-02 DIAGNOSIS — J440 Chronic obstructive pulmonary disease with acute lower respiratory infection: Secondary | ICD-10-CM | POA: Diagnosis not present

## 2022-12-02 DIAGNOSIS — L89611 Pressure ulcer of right heel, stage 1: Secondary | ICD-10-CM | POA: Diagnosis not present

## 2022-12-02 DIAGNOSIS — J9621 Acute and chronic respiratory failure with hypoxia: Secondary | ICD-10-CM | POA: Diagnosis not present

## 2022-12-02 DIAGNOSIS — J9622 Acute and chronic respiratory failure with hypercapnia: Secondary | ICD-10-CM | POA: Diagnosis not present

## 2022-12-02 DIAGNOSIS — Z7984 Long term (current) use of oral hypoglycemic drugs: Secondary | ICD-10-CM | POA: Diagnosis not present

## 2022-12-10 DIAGNOSIS — H9193 Unspecified hearing loss, bilateral: Secondary | ICD-10-CM | POA: Diagnosis not present

## 2022-12-10 DIAGNOSIS — I252 Old myocardial infarction: Secondary | ICD-10-CM | POA: Diagnosis not present

## 2022-12-10 DIAGNOSIS — J440 Chronic obstructive pulmonary disease with acute lower respiratory infection: Secondary | ICD-10-CM | POA: Diagnosis not present

## 2022-12-10 DIAGNOSIS — D631 Anemia in chronic kidney disease: Secondary | ICD-10-CM | POA: Diagnosis not present

## 2022-12-10 DIAGNOSIS — N184 Chronic kidney disease, stage 4 (severe): Secondary | ICD-10-CM | POA: Diagnosis not present

## 2022-12-10 DIAGNOSIS — I130 Hypertensive heart and chronic kidney disease with heart failure and stage 1 through stage 4 chronic kidney disease, or unspecified chronic kidney disease: Secondary | ICD-10-CM | POA: Diagnosis not present

## 2022-12-10 DIAGNOSIS — Z9981 Dependence on supplemental oxygen: Secondary | ICD-10-CM | POA: Diagnosis not present

## 2022-12-10 DIAGNOSIS — I5032 Chronic diastolic (congestive) heart failure: Secondary | ICD-10-CM | POA: Diagnosis not present

## 2022-12-10 DIAGNOSIS — J9622 Acute and chronic respiratory failure with hypercapnia: Secondary | ICD-10-CM | POA: Diagnosis not present

## 2022-12-10 DIAGNOSIS — Z7984 Long term (current) use of oral hypoglycemic drugs: Secondary | ICD-10-CM | POA: Diagnosis not present

## 2022-12-10 DIAGNOSIS — J9621 Acute and chronic respiratory failure with hypoxia: Secondary | ICD-10-CM | POA: Diagnosis not present

## 2022-12-10 DIAGNOSIS — L89611 Pressure ulcer of right heel, stage 1: Secondary | ICD-10-CM | POA: Diagnosis not present

## 2022-12-10 DIAGNOSIS — Z7982 Long term (current) use of aspirin: Secondary | ICD-10-CM | POA: Diagnosis not present

## 2022-12-10 DIAGNOSIS — I2510 Atherosclerotic heart disease of native coronary artery without angina pectoris: Secondary | ICD-10-CM | POA: Diagnosis not present

## 2022-12-10 DIAGNOSIS — L89621 Pressure ulcer of left heel, stage 1: Secondary | ICD-10-CM | POA: Diagnosis not present

## 2022-12-12 DIAGNOSIS — I5181 Takotsubo syndrome: Secondary | ICD-10-CM | POA: Diagnosis not present

## 2022-12-12 DIAGNOSIS — J449 Chronic obstructive pulmonary disease, unspecified: Secondary | ICD-10-CM | POA: Diagnosis not present

## 2022-12-25 DIAGNOSIS — J440 Chronic obstructive pulmonary disease with acute lower respiratory infection: Secondary | ICD-10-CM | POA: Diagnosis not present

## 2022-12-25 DIAGNOSIS — Z7984 Long term (current) use of oral hypoglycemic drugs: Secondary | ICD-10-CM | POA: Diagnosis not present

## 2022-12-25 DIAGNOSIS — I252 Old myocardial infarction: Secondary | ICD-10-CM | POA: Diagnosis not present

## 2022-12-25 DIAGNOSIS — L89621 Pressure ulcer of left heel, stage 1: Secondary | ICD-10-CM | POA: Diagnosis not present

## 2022-12-25 DIAGNOSIS — Z7982 Long term (current) use of aspirin: Secondary | ICD-10-CM | POA: Diagnosis not present

## 2022-12-25 DIAGNOSIS — D631 Anemia in chronic kidney disease: Secondary | ICD-10-CM | POA: Diagnosis not present

## 2022-12-25 DIAGNOSIS — Z9981 Dependence on supplemental oxygen: Secondary | ICD-10-CM | POA: Diagnosis not present

## 2022-12-25 DIAGNOSIS — I5032 Chronic diastolic (congestive) heart failure: Secondary | ICD-10-CM | POA: Diagnosis not present

## 2022-12-25 DIAGNOSIS — L89611 Pressure ulcer of right heel, stage 1: Secondary | ICD-10-CM | POA: Diagnosis not present

## 2022-12-25 DIAGNOSIS — H9193 Unspecified hearing loss, bilateral: Secondary | ICD-10-CM | POA: Diagnosis not present

## 2022-12-25 DIAGNOSIS — I130 Hypertensive heart and chronic kidney disease with heart failure and stage 1 through stage 4 chronic kidney disease, or unspecified chronic kidney disease: Secondary | ICD-10-CM | POA: Diagnosis not present

## 2022-12-25 DIAGNOSIS — J9621 Acute and chronic respiratory failure with hypoxia: Secondary | ICD-10-CM | POA: Diagnosis not present

## 2022-12-25 DIAGNOSIS — J9622 Acute and chronic respiratory failure with hypercapnia: Secondary | ICD-10-CM | POA: Diagnosis not present

## 2022-12-25 DIAGNOSIS — N184 Chronic kidney disease, stage 4 (severe): Secondary | ICD-10-CM | POA: Diagnosis not present

## 2022-12-25 DIAGNOSIS — I5181 Takotsubo syndrome: Secondary | ICD-10-CM | POA: Diagnosis not present

## 2022-12-25 DIAGNOSIS — I2510 Atherosclerotic heart disease of native coronary artery without angina pectoris: Secondary | ICD-10-CM | POA: Diagnosis not present

## 2023-01-05 DIAGNOSIS — J9610 Chronic respiratory failure, unspecified whether with hypoxia or hypercapnia: Secondary | ICD-10-CM | POA: Diagnosis not present

## 2023-01-05 DIAGNOSIS — J9621 Acute and chronic respiratory failure with hypoxia: Secondary | ICD-10-CM | POA: Diagnosis not present

## 2023-01-05 DIAGNOSIS — Z9981 Dependence on supplemental oxygen: Secondary | ICD-10-CM | POA: Diagnosis not present

## 2023-01-05 DIAGNOSIS — Z7984 Long term (current) use of oral hypoglycemic drugs: Secondary | ICD-10-CM | POA: Diagnosis not present

## 2023-01-05 DIAGNOSIS — J440 Chronic obstructive pulmonary disease with acute lower respiratory infection: Secondary | ICD-10-CM | POA: Diagnosis not present

## 2023-01-05 DIAGNOSIS — Z7982 Long term (current) use of aspirin: Secondary | ICD-10-CM | POA: Diagnosis not present

## 2023-01-05 DIAGNOSIS — I252 Old myocardial infarction: Secondary | ICD-10-CM | POA: Diagnosis not present

## 2023-01-05 DIAGNOSIS — N184 Chronic kidney disease, stage 4 (severe): Secondary | ICD-10-CM | POA: Diagnosis not present

## 2023-01-05 DIAGNOSIS — I5032 Chronic diastolic (congestive) heart failure: Secondary | ICD-10-CM | POA: Diagnosis not present

## 2023-01-05 DIAGNOSIS — I130 Hypertensive heart and chronic kidney disease with heart failure and stage 1 through stage 4 chronic kidney disease, or unspecified chronic kidney disease: Secondary | ICD-10-CM | POA: Diagnosis not present

## 2023-01-05 DIAGNOSIS — L89621 Pressure ulcer of left heel, stage 1: Secondary | ICD-10-CM | POA: Diagnosis not present

## 2023-01-05 DIAGNOSIS — L89611 Pressure ulcer of right heel, stage 1: Secondary | ICD-10-CM | POA: Diagnosis not present

## 2023-01-05 DIAGNOSIS — I2510 Atherosclerotic heart disease of native coronary artery without angina pectoris: Secondary | ICD-10-CM | POA: Diagnosis not present

## 2023-01-05 DIAGNOSIS — H9193 Unspecified hearing loss, bilateral: Secondary | ICD-10-CM | POA: Diagnosis not present

## 2023-01-05 DIAGNOSIS — D631 Anemia in chronic kidney disease: Secondary | ICD-10-CM | POA: Diagnosis not present

## 2023-01-05 DIAGNOSIS — Z515 Encounter for palliative care: Secondary | ICD-10-CM | POA: Diagnosis not present

## 2023-01-05 DIAGNOSIS — J449 Chronic obstructive pulmonary disease, unspecified: Secondary | ICD-10-CM | POA: Diagnosis not present

## 2023-01-05 DIAGNOSIS — J9622 Acute and chronic respiratory failure with hypercapnia: Secondary | ICD-10-CM | POA: Diagnosis not present

## 2023-01-12 DIAGNOSIS — I5181 Takotsubo syndrome: Secondary | ICD-10-CM | POA: Diagnosis not present

## 2023-01-12 DIAGNOSIS — J449 Chronic obstructive pulmonary disease, unspecified: Secondary | ICD-10-CM | POA: Diagnosis not present

## 2023-01-25 DIAGNOSIS — I5181 Takotsubo syndrome: Secondary | ICD-10-CM | POA: Diagnosis not present

## 2023-01-27 IMAGING — DX DG CHEST 2V
2 series · 2 of 2 positions shown · non-contrast
Comparison: AP chest 05/08/2021, 01/21/2021

CLINICAL DATA: Respiratory failure.  COPD.

EXAM:
CHEST - 2 VIEW

[chest pa]
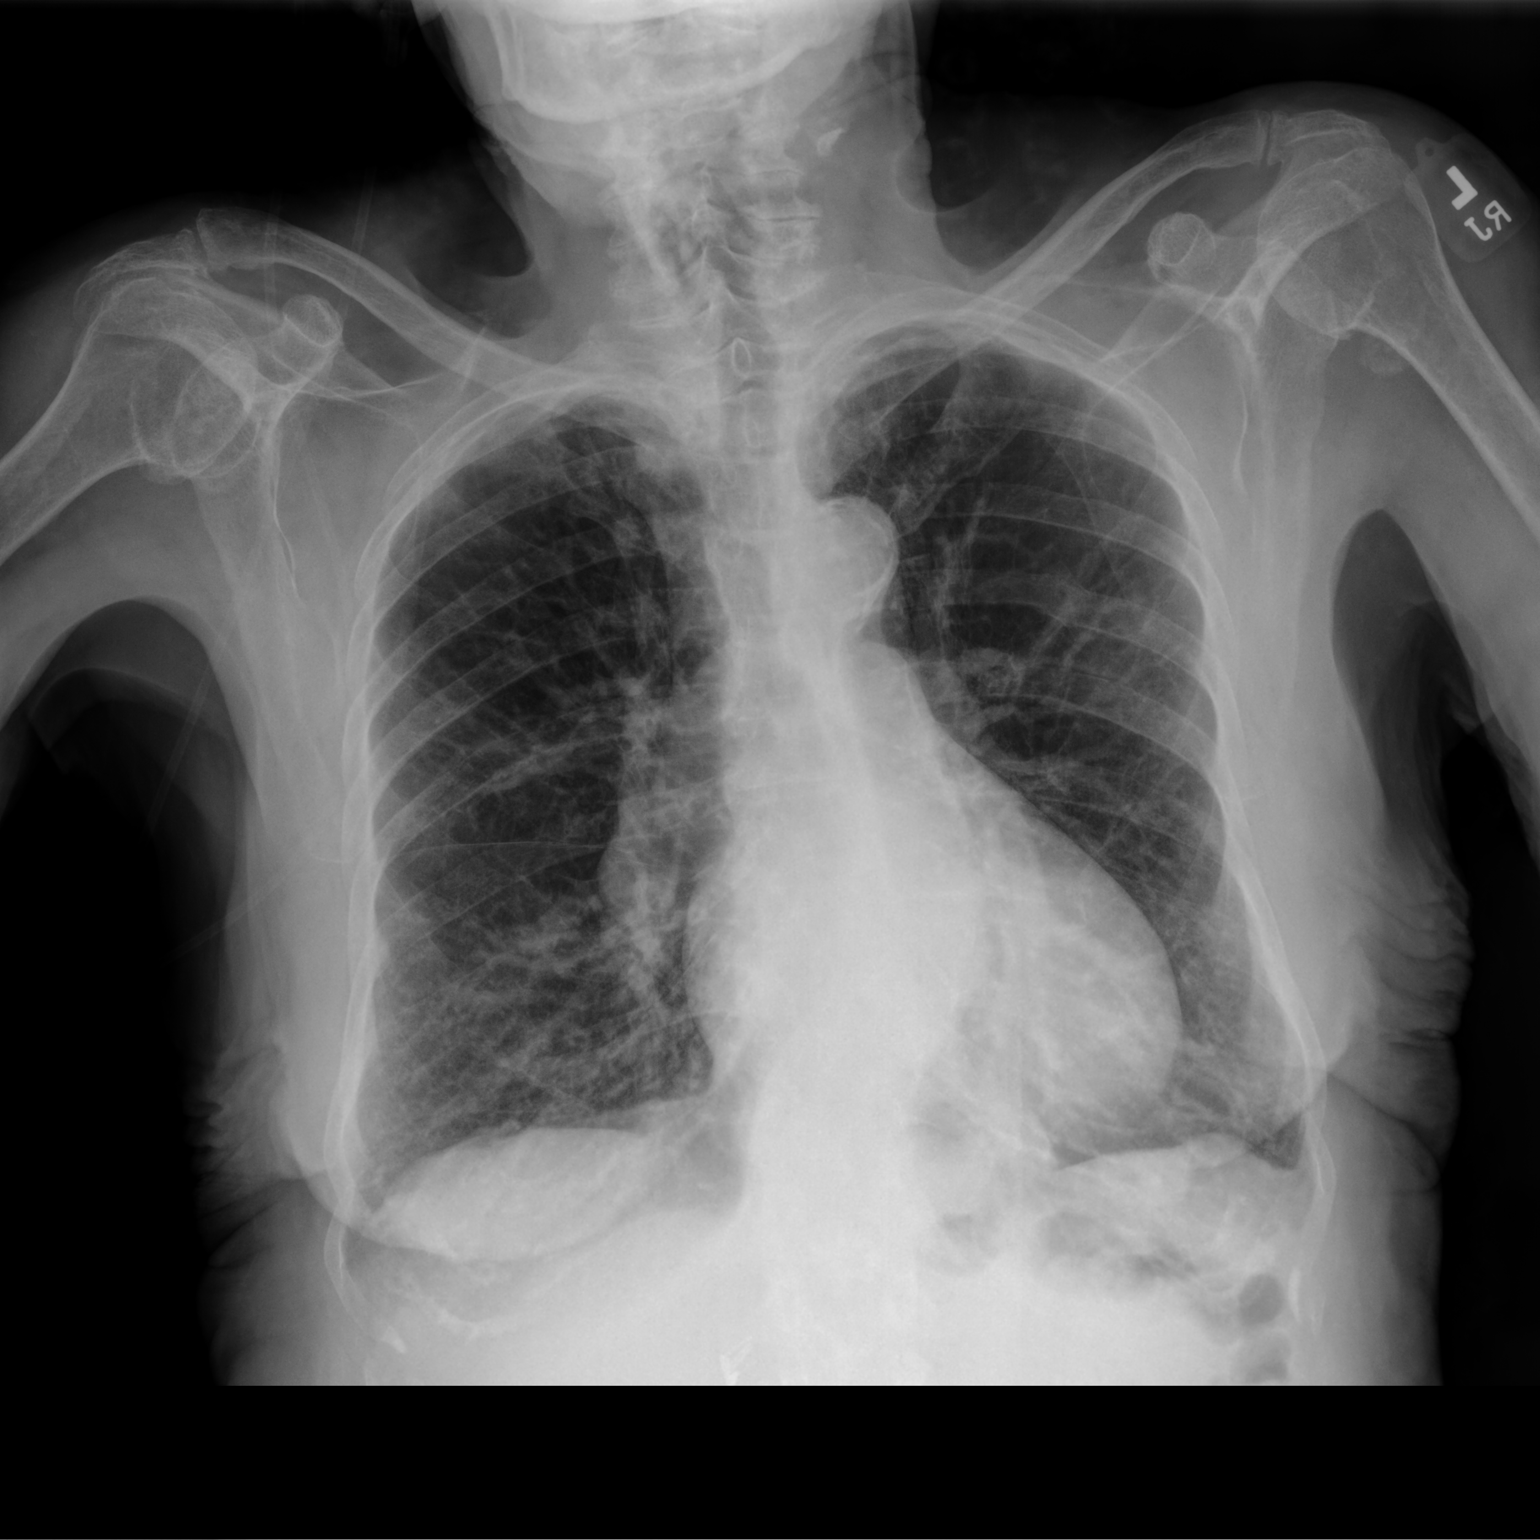

[chest lat]
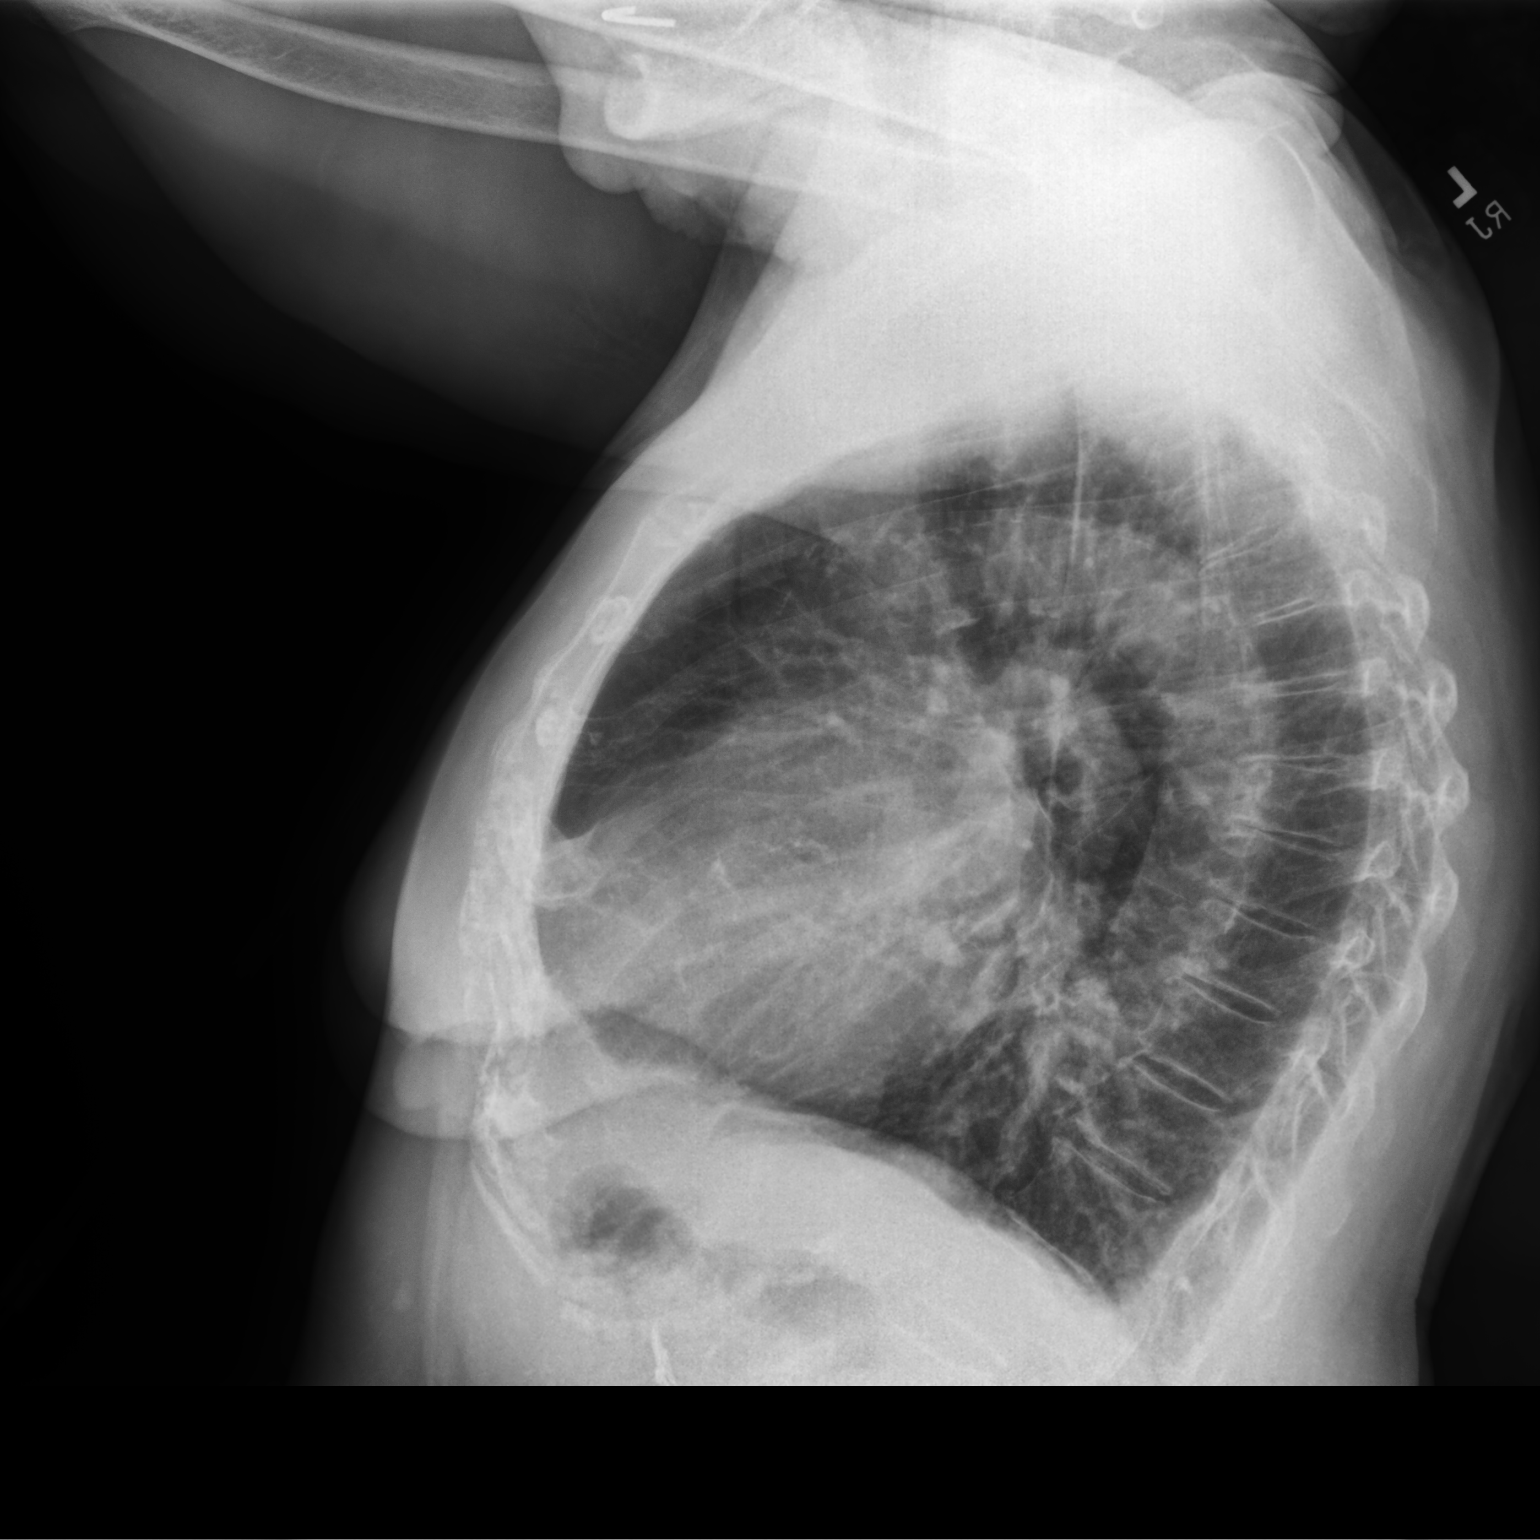

[2 of 2 positions shown; findings below may reference images not displayed]

FINDINGS: Cardiac silhouette is at the upper limits of normal size.
Mediastinal contours are within normal limits. Mild-to-moderate
calcification within aortic arch. There are cystic emphysematous
changes again suggested by lucencies within the upper lungs.
Markedly improved aeration of the bilateral lungs, especially of the
prior heterogeneous airspace opacification within the right lower
lung. This is markedly improved from 05/08/2021 and now appears very
similar to 01/21/2021 which may represent the patient's baseline
with mild scarring. Baseline mild bilateral interstitial thickening
is also similar to 01/21/2021. Flattening of the diaphragms and
moderate to high-grade hyperinflation. No pleural effusion or
pneumothorax. Moderate multilevel degenerative disc changes of the
thoracic spine. Likely cholecystectomy clips.
IMPRESSION: :
IMPRESSION: 1. There is improvement in the prior right basilar heterogeneous
airspace opacity seen on 05/08/2021. The appearance is now unchanged
from 01/21/2021 which is favored to represent the patient's baseline
of mild bilateral interstitial thickening/scarring and more focal
right basilar scarring. No definite acute lung process.
2. Moderate to high-grade hyperinflation and emphysematous changes.

## 2023-02-11 DIAGNOSIS — J449 Chronic obstructive pulmonary disease, unspecified: Secondary | ICD-10-CM | POA: Diagnosis not present

## 2023-02-11 DIAGNOSIS — I5181 Takotsubo syndrome: Secondary | ICD-10-CM | POA: Diagnosis not present

## 2023-02-23 DIAGNOSIS — I5032 Chronic diastolic (congestive) heart failure: Secondary | ICD-10-CM | POA: Diagnosis not present

## 2023-02-23 DIAGNOSIS — J9610 Chronic respiratory failure, unspecified whether with hypoxia or hypercapnia: Secondary | ICD-10-CM | POA: Diagnosis not present

## 2023-02-23 DIAGNOSIS — J449 Chronic obstructive pulmonary disease, unspecified: Secondary | ICD-10-CM | POA: Diagnosis not present

## 2023-02-23 DIAGNOSIS — Z515 Encounter for palliative care: Secondary | ICD-10-CM | POA: Diagnosis not present

## 2023-03-14 DIAGNOSIS — I5181 Takotsubo syndrome: Secondary | ICD-10-CM | POA: Diagnosis not present

## 2023-03-14 DIAGNOSIS — J449 Chronic obstructive pulmonary disease, unspecified: Secondary | ICD-10-CM | POA: Diagnosis not present

## 2023-03-18 DIAGNOSIS — I2510 Atherosclerotic heart disease of native coronary artery without angina pectoris: Secondary | ICD-10-CM | POA: Diagnosis not present

## 2023-03-18 DIAGNOSIS — I509 Heart failure, unspecified: Secondary | ICD-10-CM | POA: Diagnosis not present

## 2023-03-18 DIAGNOSIS — I10 Essential (primary) hypertension: Secondary | ICD-10-CM | POA: Diagnosis not present

## 2023-03-18 DIAGNOSIS — N189 Chronic kidney disease, unspecified: Secondary | ICD-10-CM | POA: Diagnosis not present

## 2023-03-19 DIAGNOSIS — N1832 Chronic kidney disease, stage 3b: Secondary | ICD-10-CM | POA: Diagnosis not present

## 2023-03-19 DIAGNOSIS — J9612 Chronic respiratory failure with hypercapnia: Secondary | ICD-10-CM | POA: Diagnosis not present

## 2023-03-19 DIAGNOSIS — Z131 Encounter for screening for diabetes mellitus: Secondary | ICD-10-CM | POA: Diagnosis not present

## 2023-03-19 DIAGNOSIS — I10 Essential (primary) hypertension: Secondary | ICD-10-CM | POA: Diagnosis not present

## 2023-03-19 DIAGNOSIS — I5032 Chronic diastolic (congestive) heart failure: Secondary | ICD-10-CM | POA: Diagnosis not present

## 2023-03-19 DIAGNOSIS — J440 Chronic obstructive pulmonary disease with acute lower respiratory infection: Secondary | ICD-10-CM | POA: Diagnosis not present

## 2023-03-19 DIAGNOSIS — Z0000 Encounter for general adult medical examination without abnormal findings: Secondary | ICD-10-CM | POA: Diagnosis not present

## 2023-04-14 DIAGNOSIS — I5181 Takotsubo syndrome: Secondary | ICD-10-CM | POA: Diagnosis not present

## 2023-04-14 DIAGNOSIS — J449 Chronic obstructive pulmonary disease, unspecified: Secondary | ICD-10-CM | POA: Diagnosis not present

## 2023-04-27 DIAGNOSIS — J449 Chronic obstructive pulmonary disease, unspecified: Secondary | ICD-10-CM | POA: Diagnosis not present

## 2023-04-27 DIAGNOSIS — Z515 Encounter for palliative care: Secondary | ICD-10-CM | POA: Diagnosis not present

## 2023-04-27 DIAGNOSIS — I5032 Chronic diastolic (congestive) heart failure: Secondary | ICD-10-CM | POA: Diagnosis not present

## 2023-04-27 DIAGNOSIS — J9610 Chronic respiratory failure, unspecified whether with hypoxia or hypercapnia: Secondary | ICD-10-CM | POA: Diagnosis not present

## 2023-05-14 DIAGNOSIS — J449 Chronic obstructive pulmonary disease, unspecified: Secondary | ICD-10-CM | POA: Diagnosis not present

## 2023-05-14 DIAGNOSIS — I5181 Takotsubo syndrome: Secondary | ICD-10-CM | POA: Diagnosis not present

## 2023-05-27 DIAGNOSIS — J0120 Acute ethmoidal sinusitis, unspecified: Secondary | ICD-10-CM | POA: Diagnosis not present

## 2023-06-14 DIAGNOSIS — J449 Chronic obstructive pulmonary disease, unspecified: Secondary | ICD-10-CM | POA: Diagnosis not present

## 2023-06-14 DIAGNOSIS — I5181 Takotsubo syndrome: Secondary | ICD-10-CM | POA: Diagnosis not present

## 2023-07-14 DIAGNOSIS — J449 Chronic obstructive pulmonary disease, unspecified: Secondary | ICD-10-CM | POA: Diagnosis not present

## 2023-07-14 DIAGNOSIS — I5181 Takotsubo syndrome: Secondary | ICD-10-CM | POA: Diagnosis not present

## 2023-08-14 DIAGNOSIS — J449 Chronic obstructive pulmonary disease, unspecified: Secondary | ICD-10-CM | POA: Diagnosis not present

## 2023-08-14 DIAGNOSIS — I5181 Takotsubo syndrome: Secondary | ICD-10-CM | POA: Diagnosis not present

## 2023-08-26 DIAGNOSIS — H905 Unspecified sensorineural hearing loss: Secondary | ICD-10-CM | POA: Diagnosis not present

## 2023-08-26 DIAGNOSIS — I10 Essential (primary) hypertension: Secondary | ICD-10-CM | POA: Diagnosis not present

## 2023-08-26 DIAGNOSIS — J0120 Acute ethmoidal sinusitis, unspecified: Secondary | ICD-10-CM | POA: Diagnosis not present

## 2023-08-26 DIAGNOSIS — I5032 Chronic diastolic (congestive) heart failure: Secondary | ICD-10-CM | POA: Diagnosis not present

## 2023-08-26 DIAGNOSIS — B351 Tinea unguium: Secondary | ICD-10-CM | POA: Diagnosis not present

## 2023-08-26 DIAGNOSIS — J9612 Chronic respiratory failure with hypercapnia: Secondary | ICD-10-CM | POA: Diagnosis not present

## 2023-08-26 DIAGNOSIS — N184 Chronic kidney disease, stage 4 (severe): Secondary | ICD-10-CM | POA: Diagnosis not present

## 2023-08-26 DIAGNOSIS — J440 Chronic obstructive pulmonary disease with acute lower respiratory infection: Secondary | ICD-10-CM | POA: Diagnosis not present

## 2023-09-10 DIAGNOSIS — I10 Essential (primary) hypertension: Secondary | ICD-10-CM | POA: Diagnosis not present

## 2023-09-10 DIAGNOSIS — I2510 Atherosclerotic heart disease of native coronary artery without angina pectoris: Secondary | ICD-10-CM | POA: Diagnosis not present

## 2023-09-10 DIAGNOSIS — N189 Chronic kidney disease, unspecified: Secondary | ICD-10-CM | POA: Diagnosis not present

## 2023-09-10 DIAGNOSIS — I5032 Chronic diastolic (congestive) heart failure: Secondary | ICD-10-CM | POA: Diagnosis not present

## 2023-09-14 DIAGNOSIS — I5181 Takotsubo syndrome: Secondary | ICD-10-CM | POA: Diagnosis not present

## 2023-09-14 DIAGNOSIS — J449 Chronic obstructive pulmonary disease, unspecified: Secondary | ICD-10-CM | POA: Diagnosis not present

## 2023-10-05 DIAGNOSIS — Z7902 Long term (current) use of antithrombotics/antiplatelets: Secondary | ICD-10-CM | POA: Diagnosis not present

## 2023-10-05 DIAGNOSIS — I509 Heart failure, unspecified: Secondary | ICD-10-CM | POA: Diagnosis not present

## 2023-10-05 DIAGNOSIS — T161XXA Foreign body in right ear, initial encounter: Secondary | ICD-10-CM | POA: Diagnosis not present

## 2023-10-05 DIAGNOSIS — Z7982 Long term (current) use of aspirin: Secondary | ICD-10-CM | POA: Diagnosis not present

## 2023-10-05 DIAGNOSIS — J449 Chronic obstructive pulmonary disease, unspecified: Secondary | ICD-10-CM | POA: Diagnosis not present

## 2023-10-05 DIAGNOSIS — Z87891 Personal history of nicotine dependence: Secondary | ICD-10-CM | POA: Diagnosis not present

## 2023-10-05 DIAGNOSIS — I110 Hypertensive heart disease with heart failure: Secondary | ICD-10-CM | POA: Diagnosis not present

## 2023-10-05 DIAGNOSIS — Z79899 Other long term (current) drug therapy: Secondary | ICD-10-CM | POA: Diagnosis not present

## 2023-10-05 DIAGNOSIS — Z66 Do not resuscitate: Secondary | ICD-10-CM | POA: Diagnosis not present

## 2023-10-05 DIAGNOSIS — R030 Elevated blood-pressure reading, without diagnosis of hypertension: Secondary | ICD-10-CM | POA: Diagnosis not present

## 2023-10-12 DIAGNOSIS — I5181 Takotsubo syndrome: Secondary | ICD-10-CM | POA: Diagnosis not present

## 2023-10-12 DIAGNOSIS — J449 Chronic obstructive pulmonary disease, unspecified: Secondary | ICD-10-CM | POA: Diagnosis not present

## 2023-11-12 DIAGNOSIS — I5181 Takotsubo syndrome: Secondary | ICD-10-CM | POA: Diagnosis not present

## 2023-11-12 DIAGNOSIS — J449 Chronic obstructive pulmonary disease, unspecified: Secondary | ICD-10-CM | POA: Diagnosis not present

## 2023-11-19 DIAGNOSIS — I10 Essential (primary) hypertension: Secondary | ICD-10-CM | POA: Diagnosis not present

## 2023-11-19 DIAGNOSIS — M25512 Pain in left shoulder: Secondary | ICD-10-CM | POA: Diagnosis not present

## 2023-11-19 DIAGNOSIS — N184 Chronic kidney disease, stage 4 (severe): Secondary | ICD-10-CM | POA: Diagnosis not present

## 2023-11-19 DIAGNOSIS — Z0000 Encounter for general adult medical examination without abnormal findings: Secondary | ICD-10-CM | POA: Diagnosis not present

## 2023-11-19 DIAGNOSIS — J440 Chronic obstructive pulmonary disease with acute lower respiratory infection: Secondary | ICD-10-CM | POA: Diagnosis not present

## 2023-11-19 DIAGNOSIS — I5032 Chronic diastolic (congestive) heart failure: Secondary | ICD-10-CM | POA: Diagnosis not present

## 2023-11-19 DIAGNOSIS — J9612 Chronic respiratory failure with hypercapnia: Secondary | ICD-10-CM | POA: Diagnosis not present

## 2023-12-12 DIAGNOSIS — I5181 Takotsubo syndrome: Secondary | ICD-10-CM | POA: Diagnosis not present

## 2023-12-12 DIAGNOSIS — J449 Chronic obstructive pulmonary disease, unspecified: Secondary | ICD-10-CM | POA: Diagnosis not present

## 2024-01-12 DIAGNOSIS — I5181 Takotsubo syndrome: Secondary | ICD-10-CM | POA: Diagnosis not present

## 2024-01-12 DIAGNOSIS — J449 Chronic obstructive pulmonary disease, unspecified: Secondary | ICD-10-CM | POA: Diagnosis not present

## 2024-01-20 DIAGNOSIS — Z13820 Encounter for screening for osteoporosis: Secondary | ICD-10-CM | POA: Diagnosis not present

## 2024-01-20 DIAGNOSIS — M810 Age-related osteoporosis without current pathological fracture: Secondary | ICD-10-CM | POA: Diagnosis not present

## 2024-01-20 DIAGNOSIS — Z780 Asymptomatic menopausal state: Secondary | ICD-10-CM | POA: Diagnosis not present

## 2024-01-26 DIAGNOSIS — Z6824 Body mass index (BMI) 24.0-24.9, adult: Secondary | ICD-10-CM | POA: Diagnosis not present

## 2024-01-26 DIAGNOSIS — J9602 Acute respiratory failure with hypercapnia: Secondary | ICD-10-CM | POA: Diagnosis not present

## 2024-02-11 DIAGNOSIS — J449 Chronic obstructive pulmonary disease, unspecified: Secondary | ICD-10-CM | POA: Diagnosis not present

## 2024-02-11 DIAGNOSIS — I5181 Takotsubo syndrome: Secondary | ICD-10-CM | POA: Diagnosis not present

## 2024-02-18 DIAGNOSIS — E7849 Other hyperlipidemia: Secondary | ICD-10-CM | POA: Diagnosis not present

## 2024-02-18 DIAGNOSIS — J9612 Chronic respiratory failure with hypercapnia: Secondary | ICD-10-CM | POA: Diagnosis not present

## 2024-02-18 DIAGNOSIS — Z6824 Body mass index (BMI) 24.0-24.9, adult: Secondary | ICD-10-CM | POA: Diagnosis not present

## 2024-02-18 DIAGNOSIS — I10 Essential (primary) hypertension: Secondary | ICD-10-CM | POA: Diagnosis not present

## 2024-02-18 DIAGNOSIS — R0602 Shortness of breath: Secondary | ICD-10-CM | POA: Diagnosis not present

## 2024-02-18 DIAGNOSIS — J440 Chronic obstructive pulmonary disease with acute lower respiratory infection: Secondary | ICD-10-CM | POA: Diagnosis not present

## 2024-02-18 DIAGNOSIS — J9602 Acute respiratory failure with hypercapnia: Secondary | ICD-10-CM | POA: Diagnosis not present

## 2024-02-18 DIAGNOSIS — N184 Chronic kidney disease, stage 4 (severe): Secondary | ICD-10-CM | POA: Diagnosis not present

## 2024-02-18 DIAGNOSIS — Z0000 Encounter for general adult medical examination without abnormal findings: Secondary | ICD-10-CM | POA: Diagnosis not present

## 2024-03-13 DIAGNOSIS — I5181 Takotsubo syndrome: Secondary | ICD-10-CM | POA: Diagnosis not present

## 2024-03-13 DIAGNOSIS — J449 Chronic obstructive pulmonary disease, unspecified: Secondary | ICD-10-CM | POA: Diagnosis not present

## 2024-03-30 DIAGNOSIS — Z79899 Other long term (current) drug therapy: Secondary | ICD-10-CM | POA: Diagnosis not present

## 2024-03-30 DIAGNOSIS — J449 Chronic obstructive pulmonary disease, unspecified: Secondary | ICD-10-CM | POA: Diagnosis not present

## 2024-03-30 DIAGNOSIS — Z7982 Long term (current) use of aspirin: Secondary | ICD-10-CM | POA: Diagnosis not present

## 2024-03-30 DIAGNOSIS — I110 Hypertensive heart disease with heart failure: Secondary | ICD-10-CM | POA: Diagnosis not present

## 2024-03-30 DIAGNOSIS — R42 Dizziness and giddiness: Secondary | ICD-10-CM | POA: Diagnosis not present

## 2024-03-30 DIAGNOSIS — I509 Heart failure, unspecified: Secondary | ICD-10-CM | POA: Diagnosis not present

## 2024-03-30 DIAGNOSIS — J3489 Other specified disorders of nose and nasal sinuses: Secondary | ICD-10-CM | POA: Diagnosis not present

## 2024-03-30 DIAGNOSIS — Z87891 Personal history of nicotine dependence: Secondary | ICD-10-CM | POA: Diagnosis not present

## 2024-04-13 DIAGNOSIS — I5181 Takotsubo syndrome: Secondary | ICD-10-CM | POA: Diagnosis not present

## 2024-04-13 DIAGNOSIS — J449 Chronic obstructive pulmonary disease, unspecified: Secondary | ICD-10-CM | POA: Diagnosis not present

## 2024-05-02 DIAGNOSIS — M10072 Idiopathic gout, left ankle and foot: Secondary | ICD-10-CM | POA: Diagnosis not present

## 2024-05-02 DIAGNOSIS — I10 Essential (primary) hypertension: Secondary | ICD-10-CM | POA: Diagnosis not present

## 2024-05-13 DIAGNOSIS — J449 Chronic obstructive pulmonary disease, unspecified: Secondary | ICD-10-CM | POA: Diagnosis not present

## 2024-05-13 DIAGNOSIS — I5181 Takotsubo syndrome: Secondary | ICD-10-CM | POA: Diagnosis not present
